# Patient Record
Sex: Male | Born: 1935 | Race: White | Hispanic: No | Smoking: Former smoker
Health system: Southern US, Community
[De-identification: ages and names within clinical notes are randomized; demographics above are authoritative.]

## PROBLEM LIST (undated history)

## (undated) DIAGNOSIS — G609 Hereditary and idiopathic neuropathy, unspecified: Secondary | ICD-10-CM

## (undated) DIAGNOSIS — N281 Cyst of kidney, acquired: Secondary | ICD-10-CM

## (undated) DIAGNOSIS — F411 Generalized anxiety disorder: Secondary | ICD-10-CM

## (undated) DIAGNOSIS — I872 Venous insufficiency (chronic) (peripheral): Secondary | ICD-10-CM

## (undated) DIAGNOSIS — K644 Residual hemorrhoidal skin tags: Secondary | ICD-10-CM

## (undated) DIAGNOSIS — R5383 Other fatigue: Secondary | ICD-10-CM

## (undated) DIAGNOSIS — E1039 Type 1 diabetes mellitus with other diabetic ophthalmic complication: Secondary | ICD-10-CM

## (undated) DIAGNOSIS — M25579 Pain in unspecified ankle and joints of unspecified foot: Secondary | ICD-10-CM

## (undated) DIAGNOSIS — J310 Chronic rhinitis: Secondary | ICD-10-CM

## (undated) DIAGNOSIS — R269 Unspecified abnormalities of gait and mobility: Secondary | ICD-10-CM

## (undated) DIAGNOSIS — I4891 Unspecified atrial fibrillation: Secondary | ICD-10-CM

## (undated) DIAGNOSIS — L570 Actinic keratosis: Secondary | ICD-10-CM

## (undated) DIAGNOSIS — I482 Chronic atrial fibrillation, unspecified: Secondary | ICD-10-CM

## (undated) DIAGNOSIS — I509 Heart failure, unspecified: Secondary | ICD-10-CM

## (undated) DIAGNOSIS — E1065 Type 1 diabetes mellitus with hyperglycemia: Secondary | ICD-10-CM

## (undated) DIAGNOSIS — H6120 Impacted cerumen, unspecified ear: Secondary | ICD-10-CM

## (undated) DIAGNOSIS — Z8673 Personal history of transient ischemic attack (TIA), and cerebral infarction without residual deficits: Secondary | ICD-10-CM

## (undated) DIAGNOSIS — I350 Nonrheumatic aortic (valve) stenosis: Secondary | ICD-10-CM

## (undated) DIAGNOSIS — F3289 Other specified depressive episodes: Secondary | ICD-10-CM

## (undated) DIAGNOSIS — N529 Male erectile dysfunction, unspecified: Secondary | ICD-10-CM

## (undated) DIAGNOSIS — M25569 Pain in unspecified knee: Secondary | ICD-10-CM

## (undated) DIAGNOSIS — L89309 Pressure ulcer of unspecified buttock, unspecified stage: Secondary | ICD-10-CM

## (undated) DIAGNOSIS — E785 Hyperlipidemia, unspecified: Secondary | ICD-10-CM

## (undated) DIAGNOSIS — I2510 Atherosclerotic heart disease of native coronary artery without angina pectoris: Secondary | ICD-10-CM

## (undated) DIAGNOSIS — K5730 Diverticulosis of large intestine without perforation or abscess without bleeding: Secondary | ICD-10-CM

## (undated) DIAGNOSIS — M76899 Other specified enthesopathies of unspecified lower limb, excluding foot: Secondary | ICD-10-CM

## (undated) DIAGNOSIS — D649 Anemia, unspecified: Secondary | ICD-10-CM

## (undated) DIAGNOSIS — IMO0002 Reserved for concepts with insufficient information to code with codable children: Secondary | ICD-10-CM

## (undated) DIAGNOSIS — E669 Obesity, unspecified: Secondary | ICD-10-CM

## (undated) DIAGNOSIS — R609 Edema, unspecified: Secondary | ICD-10-CM

## (undated) DIAGNOSIS — H8110 Benign paroxysmal vertigo, unspecified ear: Secondary | ICD-10-CM

## (undated) DIAGNOSIS — E559 Vitamin D deficiency, unspecified: Secondary | ICD-10-CM

## (undated) DIAGNOSIS — M5430 Sciatica, unspecified side: Secondary | ICD-10-CM

## (undated) DIAGNOSIS — L723 Sebaceous cyst: Secondary | ICD-10-CM

## (undated) DIAGNOSIS — I10 Essential (primary) hypertension: Secondary | ICD-10-CM

## (undated) DIAGNOSIS — D126 Benign neoplasm of colon, unspecified: Secondary | ICD-10-CM

## (undated) DIAGNOSIS — F329 Major depressive disorder, single episode, unspecified: Secondary | ICD-10-CM

## (undated) DIAGNOSIS — M1710 Unilateral primary osteoarthritis, unspecified knee: Secondary | ICD-10-CM

## (undated) DIAGNOSIS — R42 Dizziness and giddiness: Secondary | ICD-10-CM

## (undated) DIAGNOSIS — I4510 Unspecified right bundle-branch block: Secondary | ICD-10-CM

## (undated) DIAGNOSIS — M2540 Effusion, unspecified joint: Secondary | ICD-10-CM

## (undated) DIAGNOSIS — R5381 Other malaise: Secondary | ICD-10-CM

## (undated) DIAGNOSIS — I6990 Unspecified sequelae of unspecified cerebrovascular disease: Secondary | ICD-10-CM

## (undated) DIAGNOSIS — I6529 Occlusion and stenosis of unspecified carotid artery: Secondary | ICD-10-CM

## (undated) HISTORY — DX: Type 1 diabetes mellitus with hyperglycemia: E10.65

## (undated) HISTORY — DX: Other malaise: R53.81

## (undated) HISTORY — DX: Benign paroxysmal vertigo, unspecified ear: H81.10

## (undated) HISTORY — DX: Obesity, unspecified: E66.9

## (undated) HISTORY — DX: Pressure ulcer of unspecified buttock, unspecified stage: L89.309

## (undated) HISTORY — DX: Effusion, unspecified joint: M25.40

## (undated) HISTORY — DX: Unilateral primary osteoarthritis, unspecified knee: M17.10

## (undated) HISTORY — DX: Unspecified abnormalities of gait and mobility: R26.9

## (undated) HISTORY — DX: Generalized anxiety disorder: F41.1

## (undated) HISTORY — DX: Chronic rhinitis: J31.0

## (undated) HISTORY — DX: Major depressive disorder, single episode, unspecified: F32.9

## (undated) HISTORY — DX: Unspecified right bundle-branch block: I45.10

## (undated) HISTORY — DX: Actinic keratosis: L57.0

## (undated) HISTORY — DX: Heart failure, unspecified: I50.9

## (undated) HISTORY — DX: Other specified enthesopathies of unspecified lower limb, excluding foot: M76.899

## (undated) HISTORY — DX: Type 1 diabetes mellitus with other diabetic ophthalmic complication: E10.39

## (undated) HISTORY — DX: Male erectile dysfunction, unspecified: N52.9

## (undated) HISTORY — DX: Hyperlipidemia, unspecified: E78.5

## (undated) HISTORY — DX: Anemia, unspecified: D64.9

## (undated) HISTORY — DX: Venous insufficiency (chronic) (peripheral): I87.2

## (undated) HISTORY — DX: Essential (primary) hypertension: I10

## (undated) HISTORY — DX: Personal history of transient ischemic attack (TIA), and cerebral infarction without residual deficits: Z86.73

## (undated) HISTORY — PX: CATARACT EXTRACTION, BILATERAL: SHX1313

## (undated) HISTORY — DX: Sciatica, unspecified side: M54.30

## (undated) HISTORY — DX: Nonrheumatic aortic (valve) stenosis: I35.0

## (undated) HISTORY — PX: COLON SURGERY: SHX602

## (undated) HISTORY — DX: Pain in unspecified ankle and joints of unspecified foot: M25.579

## (undated) HISTORY — PX: OTHER SURGICAL HISTORY: SHX169

## (undated) HISTORY — DX: Occlusion and stenosis of unspecified carotid artery: I65.29

## (undated) HISTORY — DX: Chronic atrial fibrillation, unspecified: I48.20

## (undated) HISTORY — DX: Reserved for concepts with insufficient information to code with codable children: IMO0002

## (undated) HISTORY — DX: Cyst of kidney, acquired: N28.1

## (undated) HISTORY — DX: Other fatigue: R53.83

## (undated) HISTORY — DX: Other specified depressive episodes: F32.89

## (undated) HISTORY — DX: Pain in unspecified knee: M25.569

## (undated) HISTORY — DX: Benign neoplasm of colon, unspecified: D12.6

## (undated) HISTORY — DX: Hereditary and idiopathic neuropathy, unspecified: G60.9

## (undated) HISTORY — DX: Impacted cerumen, unspecified ear: H61.20

## (undated) HISTORY — DX: Residual hemorrhoidal skin tags: K64.4

## (undated) HISTORY — DX: Diverticulosis of large intestine without perforation or abscess without bleeding: K57.30

## (undated) HISTORY — PX: KIDNEY CYST REMOVAL: SHX684

## (undated) HISTORY — DX: Vitamin D deficiency, unspecified: E55.9

## (undated) HISTORY — DX: Unspecified sequelae of unspecified cerebrovascular disease: I69.90

## (undated) HISTORY — DX: Edema, unspecified: R60.9

## (undated) HISTORY — DX: Unspecified atrial fibrillation: I48.91

## (undated) HISTORY — DX: Atherosclerotic heart disease of native coronary artery without angina pectoris: I25.10

## (undated) HISTORY — DX: Dizziness and giddiness: R42

## (undated) HISTORY — DX: Sebaceous cyst: L72.3

---

## 1985-06-27 HISTORY — PX: CIRCUMCISION: SUR203

## 1990-06-27 HISTORY — PX: KNEE ARTHROSCOPY: SUR90

## 1990-06-27 HISTORY — PX: NEPHRECTOMY: SHX65

## 1991-06-28 HISTORY — PX: ASPIRATION / INJECTION RENAL CYST: SUR114

## 1998-01-15 ENCOUNTER — Inpatient Hospital Stay (HOSPITAL_COMMUNITY): Admission: EM | Admit: 1998-01-15 | Discharge: 1998-01-19 | Payer: Self-pay | Admitting: Internal Medicine

## 1998-03-09 ENCOUNTER — Ambulatory Visit (HOSPITAL_COMMUNITY): Admission: RE | Admit: 1998-03-09 | Discharge: 1998-03-09 | Payer: Self-pay | Admitting: *Deleted

## 1999-08-23 ENCOUNTER — Encounter: Admission: RE | Admit: 1999-08-23 | Discharge: 1999-11-21 | Payer: Self-pay | Admitting: Internal Medicine

## 2001-03-19 ENCOUNTER — Other Ambulatory Visit: Admission: RE | Admit: 2001-03-19 | Discharge: 2001-03-19 | Payer: Self-pay | Admitting: Gastroenterology

## 2001-03-19 ENCOUNTER — Encounter (INDEPENDENT_AMBULATORY_CARE_PROVIDER_SITE_OTHER): Payer: Self-pay

## 2001-03-19 LAB — HM COLONOSCOPY

## 2001-09-18 ENCOUNTER — Emergency Department (HOSPITAL_COMMUNITY): Admission: EM | Admit: 2001-09-18 | Discharge: 2001-09-18 | Payer: Self-pay | Admitting: Emergency Medicine

## 2001-09-18 ENCOUNTER — Encounter: Payer: Self-pay | Admitting: Emergency Medicine

## 2003-10-06 ENCOUNTER — Encounter: Admission: RE | Admit: 2003-10-06 | Discharge: 2003-11-03 | Payer: Self-pay | Admitting: Internal Medicine

## 2006-07-12 ENCOUNTER — Ambulatory Visit: Payer: Self-pay | Admitting: Gastroenterology

## 2007-04-04 ENCOUNTER — Encounter (HOSPITAL_BASED_OUTPATIENT_CLINIC_OR_DEPARTMENT_OTHER): Admission: RE | Admit: 2007-04-04 | Discharge: 2007-07-03 | Payer: Self-pay | Admitting: Surgery

## 2008-07-06 HISTORY — PX: OTHER SURGICAL HISTORY: SHX169

## 2008-07-08 ENCOUNTER — Inpatient Hospital Stay (HOSPITAL_BASED_OUTPATIENT_CLINIC_OR_DEPARTMENT_OTHER): Admission: RE | Admit: 2008-07-08 | Discharge: 2008-07-08 | Payer: Self-pay | Admitting: Cardiology

## 2009-04-12 IMAGING — CR DG CHEST 2V
1 series · 2 of 2 positions shown · non-contrast
Comparison: NONE

CLINICAL DATA: Shortness of breath. Fatigue. 

CHEST TWO VIEW (PA AND LATERAL)

[Series 1: view not recorded · 0.17mm/px · 2 of 2 slices shown]
[im 1/2]
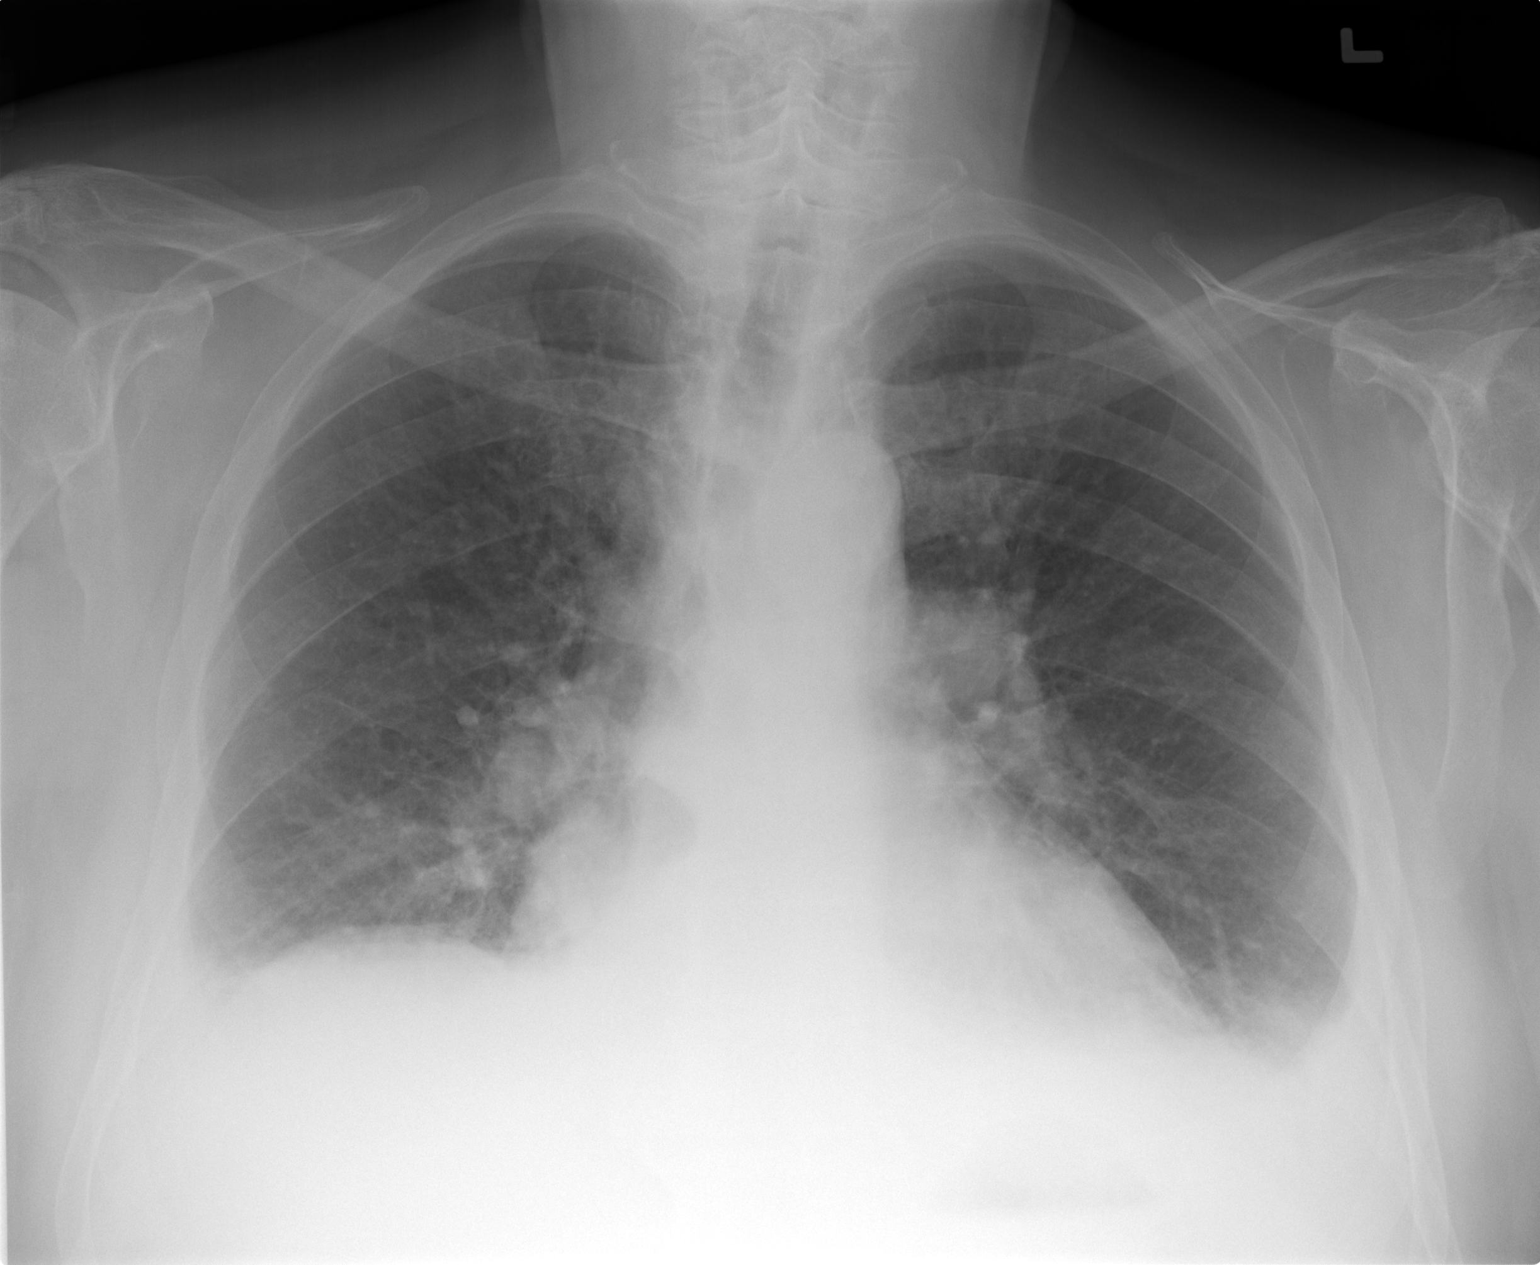
[im 2/2]
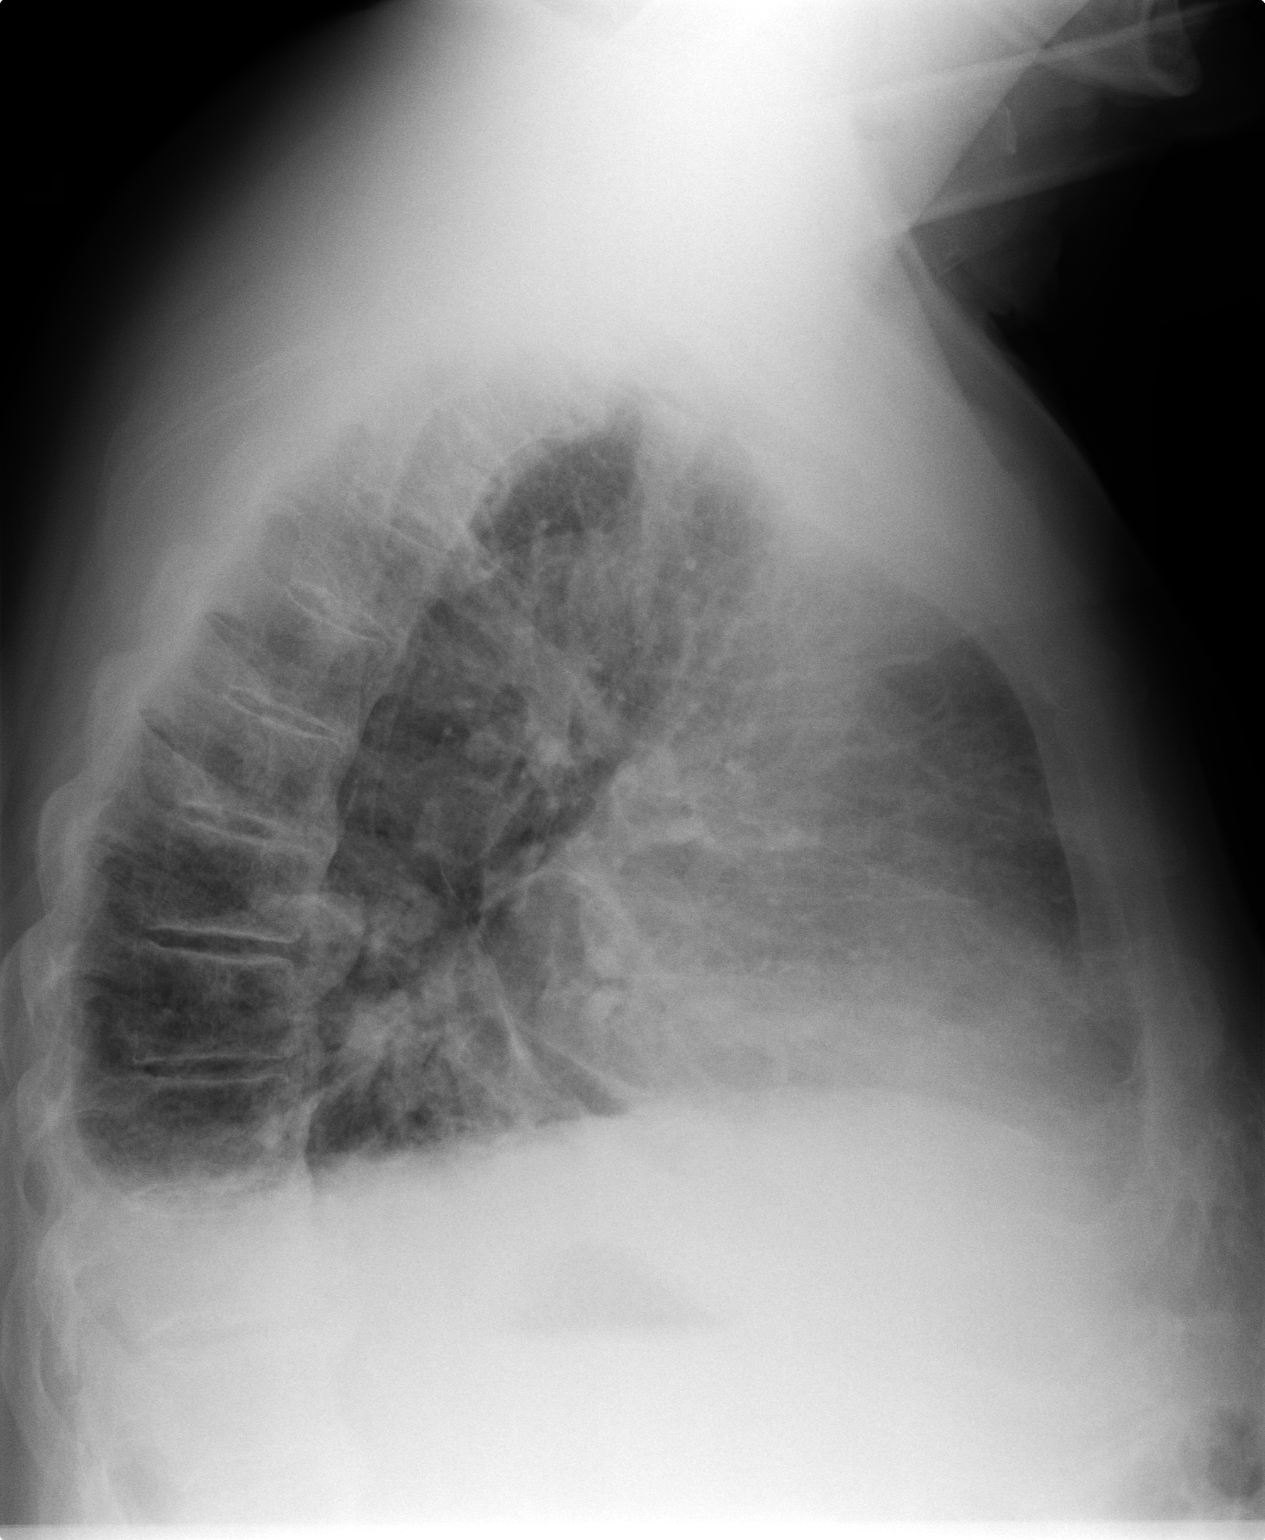

[2 of 2 positions shown; findings below may reference images not displayed]

FINDINGS: The heart is upper limits of normal in size. Bilateral 
pleural effusions with interstitial edema.
IMPRESSION: Mild congestive heart failure. Tiger, Chai Date:  06/09/2008 DAS  [REDACTED]

## 2010-10-11 LAB — POCT I-STAT GLUCOSE
Glucose, Bld: 189 mg/dL — ABNORMAL HIGH (ref 70–99)
Operator id: 194801

## 2010-11-09 NOTE — H&P (Signed)
NAME:  Logan Sharp, Logan Sharp NO.:  [id number]   MEDICAL RECORD NO.:  [id number]          PATIENT TYPE:  OIB   LOCATION:  NA                           FACILITY:  [facility]   PHYSICIAN:  Peter M. Swaziland, M.D.  DATE OF BIRTH:  [birth year]   DATE OF ADMISSION:  DATE OF DISCHARGE:                              HISTORY & PHYSICAL   HISTORY OF PRESENT ILLNESS:  Mr. Purohit is an 77 year old white male who  is seen for evaluation of arrhythmia and congestive heart failure.  The  patient had extensive evaluation 3-4 years ago, at which time he had an  echocardiogram and an adenosine Cardiolite study.  His Cardiolite study  at that time was unremarkable.  He apparently was tried on Toprol and  then Coreg, but was unable to tolerate either due to significant  dizziness, fatigue, and confusion.  He has been unaware of his heart  skipping and is only noted when someone checks his pulse or ECG.  He  denied any chest pain, shortness of breath, or palpitations until  recently when he did note some increased shortness of breath and chest x-  ray showed evidence of mild pulmonary edema.  He was started on Lasix at  that time and his shortness of breath resolved.  The patient was found  to be in atrial fibrillation.  Subsequent evaluation with an  echocardiogram on July 02, 2008, showed moderate concentric LVH with  low normal systolic function, ejection fraction is estimated at 50%.  He  had mild aortic stenosis with a valve gradient peak of 13 mmHg, mean  gradient of 7.1 mmHg, and aortic valve area of 2 sq. cm.  He had mild  mitral and tricuspid insufficiency, moderate left atrial enlargement,  and mild pulmonary hypertension.  He further underwent an adenosine  Cardiolite study on July 03, 2007, which showed evidence of moderate  lateral wall ischemia and moderate LV dysfunction with ejection fraction  of 40%.  Based on these findings,  it was recommended that the patient  undergo cardiac  catheterization.   PAST MEDICAL HISTORY:  1. Diabetes mellitus, insulin requiring.  2. Hypertension.  3. Peripheral neuropathy.  4. Prior TIA 3 years ago.  5. Chronic atrial fibrillation.  6. Status post removal of cyst from his kidney in 1993.  7. History of colon surgery in 1993 and 1994.  8. History of peptic ulcer disease with bleeding, last episode in      1999.   He has no known allergies.   CURRENT MEDICATIONS:  1. Benicar HCT 40/25 mg per day.  2. Novolin insulin 25 units subcu in the morning, 15 units subcu in      the evening.  3. Doxepin 50 mg at bedtime.  4. Furosemide 40 mg per day.  5. Coumadin 5 mg 6 days a week and 7.5 mg 1 day a week.   SOCIAL HISTORY:  The patient is a retired Medical illustrator.  He has prior  history of alcoholism and quit in 1974.  He also quit smoking in 1974.  He is divorced and has no children.  FAMILY HISTORY:  Father died at age 1 of unknown causes.  He was also  an alcoholic.  Mother died at age 2 from natural causes.  One brother  is aged 47 and has had 3 prior myocardial infarctions.  One sister aged  52 has had an aortic valve replacement.   REVIEW OF SYSTEMS:  The patient denies any orthopnea, PND, or edema.  He  denies any bleeding difficulties.  He has had no claudication.  He has  no recent change in bowel or bladder habits.  All other review of  systems are negative and are reviewed.   PHYSICAL EXAMINATION:  GENERAL:  The patient is a pleasant white male,  in no apparent distress.  VITAL SIGNS:  Weighs 246, blood pressure is 162/80, pulse is 100 and  irregular, and respirations are normal.  HEENT:  He is normocephalic and atraumatic.  Pupils equal, round, and  reactive to light and accommodation.  Extraocular movements are full.  Oropharynx is clear.  NECK:  Supple without JVD, adenopathy, thyromegaly, or bruits.  LUNGS:  Clear to auscultation and percussion.  CARDIAC:  Irregular rate and rhythm with a grade 2/6 systolic  murmur at  the apex and across the precordium.  ABDOMEN:  Obese, soft, and nontender without mass or bruits.  EXTREMITIES:  Without edema.  Pedal pulses are 2+ and symmetric.  SKIN:  Warm and dry.  NEUROLOGIC:  Nonfocal.   LABORATORY DATA:  ECG shows atrial fibrillation with occasional PVCs.  He has an incomplete right bundle-branch block pattern.  Chest x-ray  today shows mild cardiomegaly with pulmonary venous hypertension.  There  is no edema.  His glucose is 249, BUN 26, and creatinine 1.0.  Electrolytes are normal.  CBC is normal with the exception of platelet  count of 103,000.  Coags are pending.   IMPRESSION:  1. Abnormal adenosine Cardiolite study consistent with lateral wall      ischemia.  2. Chronic persistent atrial fibrillation, rate control was      suboptimal.  3. Congestive heart failure, recent onset, improved with diuretic      therapy.  4. Diabetes mellitus, insulin requiring.  5. Hypertension.  6. Lipid status is unknown.   PLAN:  We will proceed with diagnostic cardiac catheterization.  Depending on these results, we will likely resume his Coumadin  afterwards.  Recommended addition of Lanoxin to further his rate  control.  He has a history of intolerance to beta-blockers in the past.  Further plans pending his cardiac catheterization results.           ______________________________  Peter M. Swaziland, M.D.     PMJ/MEDQ  D:  07/07/2008  T:  07/08/2008  Job:  [id number]   cc:   Lenon Curt. Chilton Si, M.D.
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
## 2010-11-09 NOTE — Consult Note (Signed)
NAME:  Logan Sharp, Logan Sharp              ACCOUNT NO.:  [id number]   MEDICAL RECORD NO.:  [id number]          PATIENT TYPE:  REC   LOCATION:  FOOT                         FACILITY:  [facility]   PHYSICIAN:  Jonelle Sports. Sevier, M.D. DATE OF BIRTH:  11/11/1923   DATE OF CONSULTATION:  05/02/2007  DATE OF DISCHARGE:                                 CONSULTATION   HISTORY:  This 77 year old white male is seen with diabetic ulcerations  on the dorsal aspects of both feet.  These apparently occurred when he  wore some Dock-sider-type shoes without socks, and walked a modest  distance on uneven terrain, rubbing dorsal lesions on his feet which  then got secondarily aggravated by an allergic reaction to Neosporin.  He has been treated here with Prisma hydrogel and dry dressings, as well  as, in healing-type sandals, and his wounds have progressively improved.  Since he was last seen he has noted himself continued improvement, in  particular in the wound of the left foot, and it is now essentially  resolved.   He reports no new systemic or other worrisome symptoms.   PHYSICAL EXAMINATION:  VITAL SIGNS:  Blood pressure 172/82, pulse 92  regular, respirations 18, temperature 98.9 capillary blood glucose 113.  FOCUSED EXAM:  The linear wound overlying the distal aspect of the first  metatarsal head area and MP joint on the right is indeed resolved with  minimal crust at its surface.  That on the right dorsal forefoot  laterally at the base of the fourth and fifth toes measures 3.2 x 1.7 x  1.1 cm which is not largely changed, but there is clearly improvement in  the appearance of the wound base which has 1) filled in a bit and 2) has  nicely granulated.   IMPRESSION:  Improvement diabetic foot ulcers with healing of that on  the left.   DISPOSITION:  1. The left foot can now return to normal footwear, but this should be      used only with socks worn, and he is requested to make sure that      shoe is  properly fitting with no slippage of foot within the shoe;      and yet on the other hand, the shoe not too tight so as to create      pressure problems.  Also he is advised to inspect his foot      frequently for any renewed problems.  2. The wound on the right foot will be treated, again, with an      application of Prisma, and hydrogel covered with a Telfa pad, and      then a dry dressing.  He is to keep this foot dry and not to change      the dressing until 5 days.  At that point, he is advised he may      remove the dressing, and may begin to treat that wound simply with      Polysporin and a dry dressing.  He is to remain in protective      footwear on that extremity.  FOLLOWUP VISIT:  To be here in 2 weeks with the anticipation that this  will likely be healed at that time.           ______________________________  Jonelle Sports Cheryll Cockayne, M.D.     RES/MEDQ  D:  05/02/2007  T:  05/03/2007  Job:  [id number]

## 2010-11-09 NOTE — Assessment & Plan Note (Signed)
[facility]   NAME:  Logan Sharp, Logan Sharp              ACCOUNT NO.:  [id number]   MEDICAL RECORD NO.:  [id number]      DATE OF BIRTH:  1924-02-28   PHYSICIAN:  Theresia Majors. Tanda Rockers, M.D. VISIT DATE:  06/06/2007                                   OFFICE VISIT   SUBJECTIVE:  Logan Sharp returns for followup of bilateral stasis with  ulcerations on the right dorsal and the left dorsal feet.  In the  interim. he reports that there has been no drainage.  These wounds are  essentially healed.  There has been no fever.  He continues to be  ambulatory.   OBJECTIVE:  Blood pressure 166/94, respirations 18, pulse rate 96,  temperature 98.3.  Inspection of the lower extremity does indeed confirm  that wounds two and one are completely resolved.  There is persistence  of 2+ edema.  Capillary refill is brisk.  Both legs are warm but not  feverish.  There is no evidence of ischemia.   ASSESSMENT:  Resolved stasis.   PLAN:  We are recommending that the patient procure and wear bilateral  30-40 mm compression hose open toe.  We have explained the use of the  hose and the control of edema to the patient in terms that he seems to  understand.  We have given him an opportunity to ask questions.  He  expresses gratitude for having been seen in the clinic and indicates  that he will be compliant.  The patient is discharged.      Harold A. Tanda Rockers, M.D.  Electronically Signed     HAN/MEDQ  D:  06/06/2007  T:  06/07/2007  Job:  [id number]   cc:   Lenon Curt. Chilton Si, M.D.

## 2010-11-09 NOTE — Cardiovascular Report (Signed)
NAME:  Logan Sharp, Logan Sharp NO.:  [id number]   MEDICAL RECORD NO.:  [id number]          PATIENT TYPE:  OIB   LOCATION:  1961                         FACILITY:  [facility]   PHYSICIAN:  Peter M. Swaziland, M.D.  DATE OF BIRTH:  1923-07-20   DATE OF PROCEDURE:  07/08/2008  DATE OF DISCHARGE:  07/08/2008                            CARDIAC CATHETERIZATION   INDICATIONS FOR PROCEDURE:  An 77 year old white male who has chronic  persistent atrial fibrillation.  He has had recent symptoms of  congestive heart failure.  Cardiolite study was abnormal showing  evidence of extensive lateral wall ischemia.   PROCEDURES:  1. Left heart catheterization.  2. Coronary and left ventricular angiography.   EQUIPMENTS USED:  1. A 4-French arterial sheath.  2. A 4-French left Judkins catheter.  3. A 4-French 3DRC catheter.  4. A 4-French pigtail catheter.   MEDICATIONS:  Local anesthesia with 1% Xylocaine.   TOTAL CONTRAST USED:  90 mL of Omnipaque.   HEMODYNAMIC DATA:  Aortic pressure is 166/76 with a mean of 114.  Left  ventricle pressure is 162 with an EDP of 26 mmHg.   ANGIOGRAPHIC DATA:  The left coronary artery arises and distributes  normally.  The left main coronary is calcified.  There is 20-30%  narrowing in the distal left main.   Left anterior descending artery gives rise to a large diagonal branch.  The LAD has calcified with 40-50% stenosis after the first diagonal.  The first diagonal also has about 40-50% disease proximally.   The left circumflex coronary has diffuse 30% narrowing proximally and is  calcified.  It is occluded after the first obtuse marginal vessel.  The  first obtuse marginal vessel has a 50% stenosis at its origin.   The right coronary artery arises and distributes normally.  It is a very  large dominant vessel.  It is mild to moderately calcified in the  proximal to mid vessel.  There is 30% narrowing proximally to the crux.  The PDA and  posterolateral branches are without significant obstructive  disease.   The left ventricular angiography was performed in the RAO view.  This  demonstrates normal left ventricular size.  There is modest inferior  wall hypokinesia with overall ejection fraction estimated at 45-50%.  There is no significant mitral insufficiency.   FINAL INTERPRETATION:  1. Single-vessel occlusive atherosclerotic coronary artery disease      involving the mid left circumflex coronary.  The patient has modest      nonobstructive disease in the left anterior descending and diagonal      distribution.  2. Mild left ventricular dysfunction.   PLAN:  Recommend continued medical therapy.           ______________________________  Peter M. Swaziland, M.D.     PMJ/MEDQ  D:  08/18/2008  T:  08/19/2008  Job:  [id number]   cc:   Lenon Curt. Chilton Si, M.D.

## 2010-11-09 NOTE — Assessment & Plan Note (Signed)
[facility]   NAME:  Logan Sharp, Logan Sharp              ACCOUNT NO.:  [id number]   MEDICAL RECORD NO.:  [id number]      DATE OF BIRTH:  20-Mar-1924   PHYSICIAN:  Jonelle Sports. Sevier, M.D.  VISIT DATE:  05/16/2007                                   OFFICE VISIT   HISTORY:  This 77 year old white male has been followed for abraded type  diabetic foot ulcers on the dorsal aspect of both feet which came from  friction of improper footwear.   When he was last seen a week ago that on the dorsal aspect of the left  foot had healed but he raises concerns today that there is a rough spot  there that he is concerned about.   He has been treating the lesion on the dorsal aspect of the right foot  with cleansing and Polysporin and dry dressing each day at home.  He  reports that he feels there has been progress.  There has been no odor,  no drainage, no increased swelling, no pain and no fever or systemic  symptoms.  Blood pressure is 177/92, about which the patient is  cautioned.  Pulse is 60, respirations 18, temperature is 98.5. Random  blood glucose 114 mg/dL.   The linear wound site on the dorsal aspect of the right foot first and  second metatarsal areas is indeed somewhat crusty and there is one  palpable elevated area in the distal portion of the wound which is of  concern to the patient.   The wound on the dorsal aspect of the left foot laterally over the fifth  metatarsal head area measures 2.1 x 1.0 x 0.1 cm and again has some  fibrinopurulent slough in the base.   IMPRESSION:  Satisfactory course of diabetic wounds both feet.   DISPOSITION:  The wound on the left foot is sharply debrided of some of  the crust and particularly I was able to shave down the area that was  quite elevated and of concern to the patient and was able to do this  without revealing any new ulceration.   It is recommended to the patient that he use some cocoa butter for  continued softening  in that area.   The ulcer on the right foot is treated with a sharp selective  debridement of this slough from the wound base and it is then treated  with an application of Bactroban and a Band-Aid dressing.  He will  continue to address this wound at home with daily cleansing, Polysporin  and a Band-Aid.   He is cautioned to wear shoes that avoid too much pressure on the dorsal  aspect of his feet.  He asked about playing golf and is advised that it  is a bit early for that yet.   Follow-up visit will be here in 3 weeks.          ______________________________  Jonelle Sports Cheryll Cockayne, M.D.    RES/MEDQ  D:  05/16/2007  T:  05/17/2007  Job:  [id number]
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
## 2010-11-12 NOTE — Assessment & Plan Note (Signed)
[facility]                         GASTROENTEROLOGY OFFICE NOTE   NAME:Logan Sharp, DAUNTE OESTREICH                     MRN:          [id number]  DATE:07/12/2006                            DOB:          [birth year]    CHIEF COMPLAINT:  An 77 year old white male with insulin-dependent  diabetes mellitus.  He returns for followup of adenomatous colon polyps.  His primary physician is Dr. Frederik Pear.  The patient has had no  significant gastrointestinal complaints except for some mild  constipation when he first began Benicar in September 2007.  These  symptoms spontaneously resolved.  He has no other abdominal complaints  and, specifically denies any abdominal pain, change in bowel habits,  change in stool caliber, melena, hematochezia, or weight loss.  There is  no family history of colon cancer, colon polyps, or inflammatory bowel  disease.  He has a history of adenomatous colon polyps first diagnosed  in 1994; the largest polyp was approximately 15 mm.  His most recent  followup colonoscopy was in September 2002 which showed diverticulosis  and a hyperplastic colon polyp.  He is status post a right hemicolectomy  for acute appendicitis complicated by rupture and appendiceal abscess in  1994.   PAST MEDICAL HISTORY:  1. Hypertension.  2. Diabetes mellitus.  3. Arthritis, status post right hemicolectomy for an appendiceal      rupture and abscess.  4. Adenomatous colon polyps.  5. Diverticulosis.   MEDICATIONS:  Listed on the chart, reviewed.   MEDICATION ALLERGIES:  None known.   SOCIAL HISTORY AND REVIEW OF SYSTEMS:  Per the handwritten form.   PHYSICAL EXAM:  No acute distress.  Height 6 feet 2 inches.  Weight 257.4 pounds.  Blood pressure 172/80.  Pulse 68 and irregular.  HEENT: Anicteric sclerae.  Oropharynx clear.  CHEST:  Clear to auscultation bilaterally.  CARDIAC:  Regular rate and rhythm without murmurs appreciated.  ABDOMEN:  Soft, nontender,  nondistended.  Normoactive bowel sounds.  No  palpable organomegaly, masses, or hernias.  RECTAL:  Deferred at the time of colonoscopy.  EXTREMITIES:  Without clubbing, cyanosis, or edema.  NEUROLOGIC:  Alert and oriented x3.  Grossly nonfocal.   ASSESSMENT AND PLAN:  Personal history of adenomatous colon polyps.  Diverticulosis.  He is advised to maintain a high-fiber diet with  adequate fluid intake.  Risks, benefits, and alternatives to colonoscopy  with possible biopsy and possible polypectomy discussed with the  patient and he consents to proceed.  This will be scheduled electively.  Insulin will be adjusted as per standard protocol.     Venita Lick. Russella Dar, MD, [facility]  Electronically Signed    MTS/MedQ  DD: 07/13/2006  DT: 07/13/2006  Job #: [id number]   cc:   Lenon Curt. Chilton Si, M.D.

## 2011-10-13 ENCOUNTER — Other Ambulatory Visit: Payer: Self-pay | Admitting: Dermatology

## 2011-10-17 ENCOUNTER — Encounter: Payer: Self-pay | Admitting: *Deleted

## 2011-12-15 ENCOUNTER — Encounter: Payer: Self-pay | Admitting: Cardiology

## 2011-12-15 ENCOUNTER — Other Ambulatory Visit: Payer: Self-pay | Admitting: Cardiology

## 2012-01-02 ENCOUNTER — Emergency Department (HOSPITAL_COMMUNITY)
Admission: EM | Admit: 2012-01-02 | Discharge: 2012-01-02 | Disposition: A | Discharge status: Home / Self Care | Payer: Medicare Other | Attending: Emergency Medicine | Admitting: Emergency Medicine

## 2012-01-02 ENCOUNTER — Encounter (HOSPITAL_COMMUNITY): Payer: Self-pay | Admitting: *Deleted

## 2012-01-02 DIAGNOSIS — Z79899 Other long term (current) drug therapy: Secondary | ICD-10-CM | POA: Insufficient documentation

## 2012-01-02 DIAGNOSIS — I4891 Unspecified atrial fibrillation: Secondary | ICD-10-CM | POA: Insufficient documentation

## 2012-01-02 DIAGNOSIS — Z794 Long term (current) use of insulin: Secondary | ICD-10-CM | POA: Insufficient documentation

## 2012-01-02 DIAGNOSIS — I2510 Atherosclerotic heart disease of native coronary artery without angina pectoris: Secondary | ICD-10-CM | POA: Insufficient documentation

## 2012-01-02 DIAGNOSIS — E119 Type 2 diabetes mellitus without complications: Secondary | ICD-10-CM | POA: Insufficient documentation

## 2012-01-02 DIAGNOSIS — I509 Heart failure, unspecified: Secondary | ICD-10-CM | POA: Insufficient documentation

## 2012-01-02 DIAGNOSIS — Z87891 Personal history of nicotine dependence: Secondary | ICD-10-CM | POA: Insufficient documentation

## 2012-01-02 DIAGNOSIS — I10 Essential (primary) hypertension: Secondary | ICD-10-CM | POA: Insufficient documentation

## 2012-01-02 DIAGNOSIS — R42 Dizziness and giddiness: Secondary | ICD-10-CM | POA: Insufficient documentation

## 2012-01-02 DIAGNOSIS — Z8673 Personal history of transient ischemic attack (TIA), and cerebral infarction without residual deficits: Secondary | ICD-10-CM | POA: Insufficient documentation

## 2012-01-02 LAB — COMPREHENSIVE METABOLIC PANEL
BUN: 17 mg/dL (ref 6–23)
Calcium: 10 mg/dL (ref 8.4–10.5)
Creatinine, Ser: 0.9 mg/dL (ref 0.50–1.35)
GFR calc Af Amer: 87 mL/min — ABNORMAL LOW (ref >90)
GFR calc non Af Amer: 75 mL/min — ABNORMAL LOW (ref >90)
Glucose, Bld: 139 mg/dL — ABNORMAL HIGH (ref 70–99)
Sodium: 135 mEq/L (ref 135–145)
Total Protein: 7.8 g/dL (ref 6.0–8.3)

## 2012-01-02 LAB — GLUCOSE, CAPILLARY: Glucose-Capillary: 132 mg/dL — ABNORMAL HIGH (ref 70–99)

## 2012-01-02 LAB — POCT I-STAT, CHEM 8
BUN: 16 mg/dL (ref 6–23)
Calcium, Ion: 1.25 mmol/L (ref 1.13–1.30)
Creatinine, Ser: 1 mg/dL (ref 0.50–1.35)
Hemoglobin: 14.3 g/dL (ref 13.0–17.0)
TCO2: 23 mmol/L (ref 0–100)

## 2012-01-02 LAB — CBC
HCT: 41.2 % (ref 39.0–52.0)
Hemoglobin: 14.3 g/dL (ref 13.0–17.0)
MCH: 30.8 pg (ref 26.0–34.0)
MCHC: 33.7 g/dL (ref 30.0–36.0)
MCV: 91.6 fL (ref 78.0–100.0)
Platelets: 99 10*3/uL — ABNORMAL LOW (ref 150–400)
RBC: 4.57 MIL/uL (ref 4.22–5.81)
RBC: 4.54 MIL/uL (ref 4.22–5.81)
WBC: 7.9 10*3/uL (ref 4.0–10.5)

## 2012-01-02 MED ORDER — MECLIZINE HCL 25 MG PO TABS
50.0000 mg | ORAL_TABLET | Freq: Three times a day (TID) | ORAL | Status: AC | PRN
Start: 2012-01-02 — End: 2012-01-12

## 2012-01-02 MED ORDER — MECLIZINE HCL 25 MG PO TABS
50.0000 mg | ORAL_TABLET | Freq: Once | ORAL | Status: AC
  Administered 2012-01-02: 50 mg via ORAL
  Filled 2012-01-02: qty 2

## 2012-01-02 NOTE — ED Notes (Signed)
MD at bedside. 

## 2012-01-02 NOTE — ED Notes (Signed)
Pt states been having dizziness x 3 weeks tomorrow, been to PCP and Dr. Ezzard Standing, had ears cleaned out by PCP and then sent to therapy [facility], was suppose to start therapy tomorrow at [facility]. Dr. Ezzard Standing told him he needed to get out of house and walk more, was not given any medication.

## 2012-01-02 NOTE — ED Notes (Signed)
Pt alert and oriented x4. Respirations even and unlabored, bilateral symmetrical rise and fall of chest. Skin warm and dry. In no acute distress. Denies needs.   

## 2012-01-02 NOTE — ED Notes (Signed)
Pt stated that medication did not help and he still feels the same.

## 2012-01-02 NOTE — ED Notes (Signed)
Pt states 3 weeks from tomorrow, he's been having dizziness/weakness, states he feels like he is going to pass out, states he has gone to his PCP who cleaned his ears out and referred him to therapy [facility], which he was suppose to start tomorrow, then went and saw Dr. Ezzard Standing who told him "to get out more and walk around". Pt states dizziness is worse today, has been unable to walk d/t dizziness, feeling like he's going to pass out was so bad. Pt states before today he had been driving. Pt seems nervous laying in bed. Denies pain and n/v/d. Denies numbness/tingling/ and shortness of breath.

## 2012-01-02 NOTE — ED Notes (Signed)
Pt given discharge instructions, explained, in no distress upon discharge, escorted to discharge window.

## 2012-01-02 NOTE — ED Provider Notes (Signed)
History     CSN: [id number]  Arrival date & time 01/02/12  1006   First MD Initiated Contact with Patient 01/02/12 1055      No chief complaint on file.   (Consider location/radiation/quality/duration/timing/severity/associated sxs/prior treatment) HPI Patient reports dizziness meaning sensation of his head spinning, not lightheadedness onset 3 weeks ago becoming worse today he had trouble walking this morning, was able walk to his car with assistance. Symptoms worse with changing positions, particularly lying down from seated position or sitting up from a supine position. Improved with remaining still No pain no weakness no treatment prior to coming here. Patient evaluated by Dr. Ezzard Standing for same complaint. Patient reports he was advised that medications to treat dizziness may cause drowsiness and cause falls. Patient also evaluated by Dr. Chilton Si for same complaint who advised physical therapy. No other associated symptoms no nausea no vomiting no difficulty with speech. No focal weakness Past Medical History  Diagnosis Date  . Coronary artery disease   . CHF (congestive heart failure)   . Hypertension   . Diabetes mellitus   . Chronic atrial fibrillation   . Mild aortic stenosis   . Hx of transient ischemic attack (TIA)     Past Surgical History  Procedure Date  . Kidney cyst removal   . Colon surgery     x2  . Cadiac cath     2010    Family History  Problem Relation Age of Onset  . Heart attack    . Heart disease      History  Substance Use Topics  . Smoking status: Former Smoker    Quit date: 06/27/1972  . Smokeless tobacco: Never Used  . Alcohol Use: No     quit 1974      Review of Systems  Constitutional: Negative.   HENT: Negative.   Respiratory: Negative.   Cardiovascular: Negative.   Gastrointestinal: Negative.   Musculoskeletal: Negative.   Skin: Negative.   Neurological: Positive for dizziness.  Hematological: Negative.   Psychiatric/Behavioral:  Negative.     Allergies  Review of patient's allergies indicates no known allergies.  Home Medications   Current Outpatient Rx  Name Route Sig Dispense Refill  . VITAMIN D 1000 UNITS PO TABS Oral Take 1,000 Units by mouth daily.    Marland Kitchen CLOPIDOGREL BISULFATE 75 MG PO TABS Oral Take 75 mg by mouth daily.    Marland Kitchen DIGOXIN 0.25 MG PO TABS Oral Take 250 mcg by mouth daily.    Marland Kitchen DOXEPIN HCL 50 MG PO CAPS Oral Take 50 mg by mouth at bedtime.    . INSULIN ASPART PROT & ASPART (70-30) 100 UNIT/ML [place] SUSP Subcutaneous Inject 30-40 Units into the skin See admin instructions. 40 units before breakfast. 30 units before supper.    Marland Kitchen LOSARTAN POTASSIUM-HCTZ 100-25 MG PO TABS Oral Take 1 tablet by mouth daily.    . TORSEMIDE 20 MG PO TABS Oral Take 20 mg by mouth every Monday, Wednesday, and Friday.       BP 182/75  Pulse 45  Temp 98.8 F (37.1 C) (Oral)  Resp 12  SpO2 94%  Physical Exam  Nursing note and vitals reviewed. Constitutional: He appears well-developed and well-nourished.  HENT:  Head: Normocephalic and atraumatic.  Eyes: Conjunctivae are normal. Pupils are equal, round, and reactive to light.  Neck: Neck supple. No tracheal deviation present. No thyromegaly present.  Cardiovascular: Normal rate and regular rhythm.   No murmur heard.      Irregularly  irregular  Pulmonary/Chest: Effort normal and breath sounds normal.  Abdominal: Soft. Bowel sounds are normal. He exhibits no distension. There is no tenderness.  Musculoskeletal: Normal range of motion. He exhibits no edema and no tenderness.  Neurological: He is alert. He has normal reflexes. Coordination normal.       Becomes vertiginous upon sitting up from a seated position  Skin: Skin is warm and dry. No rash noted.  Psychiatric: He has a normal mood and affect.    ED Course  Procedures (including critical care time)  Labs Reviewed  COMPREHENSIVE METABOLIC PANEL - Abnormal; Notable for the following:    Glucose, Bld 139 (*)      Alkaline Phosphatase 148 (*)     GFR calc non Af Amer 75 (*)     GFR calc Af Amer 87 (*)     All other components within normal limits  CBC - Abnormal; Notable for the following:    Platelets 94 (*)     All other components within normal limits  GLUCOSE, CAPILLARY - Abnormal; Notable for the following:    Glucose-Capillary 132 (*)     All other components within normal limits  CBC   No results found.   No diagnosis found. 1:35 PM feels improved after treatment withMeclizine. Patient is able walk without difficulty   Date: 01/02/2012  Rate: 110  Rhythm: atrial fibrillation  QRS Axis: normal  Intervals: normal  ST/T Wave abnormalities: nonspecific T wave changes  Conduction Disutrbances:right bundle branch block  Narrative Interpretation:   Old EKG Reviewed: Slightly tachycardic in comparison to tracing of 09/18/2001 otherwise no significant change Results for orders placed during the hospital encounter of 01/02/12  COMPREHENSIVE METABOLIC PANEL      Component Value Range   Sodium 135  135 - 145 mEq/L   Potassium 3.9  3.5 - 5.1 mEq/L   Chloride 100  96 - 112 mEq/L   CO2 24  19 - 32 mEq/L   Glucose, Bld 139 (*) 70 - 99 mg/dL   BUN 17  6 - 23 mg/dL   Creatinine, Ser 1.19  0.50 - 1.35 mg/dL   Calcium 14.7  8.4 - 82.9 mg/dL   Total Protein 7.8  6.0 - 8.3 g/dL   Albumin 4.1  3.5 - 5.2 g/dL   AST 24  0 - 37 U/L   ALT 25  0 - 53 U/L   Alkaline Phosphatase 148 (*) 39 - 117 U/L   Total Bilirubin 1.1  0.3 - 1.2 mg/dL   GFR calc non Af Amer 75 (*) >90 mL/min   GFR calc Af Amer 87 (*) >90 mL/min  CBC      Component Value Range   WBC 7.9  4.0 - 10.5 K/uL   RBC 4.57  4.22 - 5.81 MIL/uL   Hemoglobin 14.3  13.0 - 17.0 g/dL   HCT 56.2  13.0 - 86.5 %   MCV 90.2  78.0 - 100.0 fL   MCH 31.3  26.0 - 34.0 pg   MCHC 34.7  30.0 - 36.0 g/dL   RDW 78.4  69.6 - 29.5 %   Platelets 94 (*) 150 - 400 K/uL  GLUCOSE, CAPILLARY      Component Value Range   Glucose-Capillary 132 (*) 70 - 99  mg/dL   Comment 1 Notify RN     Comment 2 Documented in Chart    CBC      Component Value Range   WBC 8.2  4.0 - 10.5  K/uL   RBC 4.54  4.22 - 5.81 MIL/uL   Hemoglobin 14.0  13.0 - 17.0 g/dL   HCT 16.1  09.6 - 04.5 %   MCV 91.6  78.0 - 100.0 fL   MCH 30.8  26.0 - 34.0 pg   MCHC 33.7  30.0 - 36.0 g/dL   RDW 40.9  81.1 - 91.4 %   Platelets 99 (*) 150 - 400 K/uL  POCT I-STAT, CHEM 8      Component Value Range   Sodium 141  135 - 145 mEq/L   Potassium 4.1  3.5 - 5.1 mEq/L   Chloride 104  96 - 112 mEq/L   BUN 16  6 - 23 mg/dL   Creatinine, Ser 7.82  0.50 - 1.35 mg/dL   Glucose, Bld 956 (*) 70 - 99 mg/dL   Calcium, Ion 2.13  0.86 - 1.30 mmol/L   TCO2 23  0 - 100 mmol/L   Hemoglobin 14.3  13.0 - 17.0 g/dL   HCT 57.8  46.9 - 62.9 %   No results found.  MDM  Case discussed with Dr. Gordy Levan, patient had thrombocytopenia with platelet count of 1 14,000 June 2013. Plan prescription meclizine followup at office, blood pressure recheck vertigo is felt to be peripheral in etiology . Diagnosis #1 vertigo  #2 thrombocytopenia   #3 hypertension         Doug Sou, MD 01/02/12 1340

## 2012-01-03 ENCOUNTER — Ambulatory Visit: Payer: Medicare Other | Attending: Internal Medicine | Admitting: Physical Therapy

## 2012-01-03 DIAGNOSIS — IMO0001 Reserved for inherently not codable concepts without codable children: Secondary | ICD-10-CM | POA: Insufficient documentation

## 2012-01-03 DIAGNOSIS — H8110 Benign paroxysmal vertigo, unspecified ear: Secondary | ICD-10-CM | POA: Insufficient documentation

## 2012-01-03 DIAGNOSIS — R269 Unspecified abnormalities of gait and mobility: Secondary | ICD-10-CM | POA: Insufficient documentation

## 2012-01-12 ENCOUNTER — Ambulatory Visit: Payer: Medicare Other | Admitting: Physical Therapy

## 2012-01-18 ENCOUNTER — Ambulatory Visit: Payer: Medicare Other | Admitting: Physical Therapy

## 2012-01-19 ENCOUNTER — Encounter: Payer: MEDICARE | Admitting: Physical Therapy

## 2012-01-20 ENCOUNTER — Encounter: Payer: MEDICARE | Admitting: Physical Therapy

## 2012-01-24 ENCOUNTER — Ambulatory Visit: Payer: Medicare Other | Admitting: Physical Therapy

## 2012-01-30 ENCOUNTER — Ambulatory Visit: Payer: Medicare Other | Attending: Internal Medicine | Admitting: Physical Therapy

## 2012-01-30 DIAGNOSIS — H8110 Benign paroxysmal vertigo, unspecified ear: Secondary | ICD-10-CM | POA: Insufficient documentation

## 2012-01-30 DIAGNOSIS — IMO0001 Reserved for inherently not codable concepts without codable children: Secondary | ICD-10-CM | POA: Insufficient documentation

## 2012-01-30 DIAGNOSIS — R269 Unspecified abnormalities of gait and mobility: Secondary | ICD-10-CM | POA: Insufficient documentation

## 2012-02-03 ENCOUNTER — Ambulatory Visit: Payer: MEDICARE | Admitting: Physical Therapy

## 2012-02-14 ENCOUNTER — Encounter: Payer: MEDICARE | Admitting: Physical Therapy

## 2012-02-16 ENCOUNTER — Ambulatory Visit: Payer: Medicare Other | Admitting: Physical Therapy

## 2012-02-21 ENCOUNTER — Encounter: Payer: MEDICARE | Admitting: Physical Therapy

## 2012-02-23 ENCOUNTER — Encounter: Payer: MEDICARE | Admitting: Physical Therapy

## 2012-02-28 ENCOUNTER — Encounter: Payer: MEDICARE | Admitting: Physical Therapy

## 2012-03-01 ENCOUNTER — Encounter: Payer: Self-pay | Admitting: Cardiology

## 2012-03-01 ENCOUNTER — Encounter: Payer: MEDICARE | Admitting: Physical Therapy

## 2012-03-06 ENCOUNTER — Encounter: Payer: MEDICARE | Admitting: Physical Therapy

## 2012-03-08 ENCOUNTER — Encounter: Payer: MEDICARE | Admitting: Physical Therapy

## 2012-09-20 ENCOUNTER — Other Ambulatory Visit: Payer: Self-pay | Admitting: *Deleted

## 2012-09-20 DIAGNOSIS — E1065 Type 1 diabetes mellitus with hyperglycemia: Secondary | ICD-10-CM

## 2012-09-20 DIAGNOSIS — E669 Obesity, unspecified: Secondary | ICD-10-CM

## 2012-10-03 ENCOUNTER — Encounter: Payer: Self-pay | Admitting: Cardiology

## 2012-10-03 ENCOUNTER — Ambulatory Visit (INDEPENDENT_AMBULATORY_CARE_PROVIDER_SITE_OTHER): Payer: MEDICARE | Admitting: Internal Medicine

## 2012-10-03 ENCOUNTER — Ambulatory Visit: Payer: Self-pay | Admitting: Internal Medicine

## 2012-10-03 ENCOUNTER — Encounter: Payer: Self-pay | Admitting: Internal Medicine

## 2012-10-03 VITALS — BP 148/82 | HR 66 | Temp 98.1°F | Resp 16 | Ht 73.0 in | Wt 227.6 lb

## 2012-10-03 DIAGNOSIS — I10 Essential (primary) hypertension: Secondary | ICD-10-CM

## 2012-10-03 DIAGNOSIS — E1039 Type 1 diabetes mellitus with other diabetic ophthalmic complication: Secondary | ICD-10-CM

## 2012-10-03 DIAGNOSIS — E669 Obesity, unspecified: Secondary | ICD-10-CM

## 2012-10-03 MED ORDER — INSULIN ASPART PROT & ASPART (70-30 MIX) 100 UNIT/ML ~~LOC~~ SUSP: SUBCUTANEOUS | Status: AC

## 2012-10-03 MED ORDER — TORSEMIDE 20 MG PO TABS: 20.0000 mg | ORAL_TABLET | ORAL | Status: DC

## 2012-10-03 NOTE — Patient Instructions (Signed)
Continue current medication. Return as scheduled in June.

## 2012-10-03 NOTE — Addendum Note (Signed)
Addended by: Conception Chancy R on: 10/03/2012 12:36 PM   Modules accepted: Medications

## 2012-10-04 ENCOUNTER — Other Ambulatory Visit: Payer: Self-pay | Admitting: *Deleted

## 2012-10-08 ENCOUNTER — Encounter: Payer: Self-pay | Admitting: Internal Medicine

## 2012-10-08 ENCOUNTER — Other Ambulatory Visit: Payer: Self-pay | Admitting: *Deleted

## 2012-10-08 DIAGNOSIS — I10 Essential (primary) hypertension: Secondary | ICD-10-CM | POA: Insufficient documentation

## 2012-10-08 DIAGNOSIS — E669 Obesity, unspecified: Secondary | ICD-10-CM | POA: Insufficient documentation

## 2012-10-08 DIAGNOSIS — E1139 Type 2 diabetes mellitus with other diabetic ophthalmic complication: Secondary | ICD-10-CM | POA: Insufficient documentation

## 2012-10-08 MED ORDER — DIGOXIN 250 MCG PO TABS: 250.0000 ug | ORAL_TABLET | Freq: Every day | ORAL | Status: DC

## 2012-10-08 MED ORDER — DIGOXIN 250 MCG PO TABS: 250.0000 ug | ORAL_TABLET | Freq: Every day | ORAL | Status: AC

## 2012-10-08 NOTE — Progress Notes (Signed)
  Subjective:    Patient ID: Logan Sharp, male    DOB: [birth year], 79 y.o.   MRN: [id number]  HPI Patient is here for routine visit to review chronic conditions. Hypertension is under reasonably good control. There are modest elevations in the SBP. He has no accompanying symptoms. Diabetic control is good Obesity continues to be a problem. Patient, despite a reasonably good diet, continues to have problems losing weight. He maintains his current weight.   Review of Systems  Constitutional: Negative.   HENT: Positive for hearing loss.   Eyes: Negative.        Wears prescription lenses.  Respiratory: Negative.   Cardiovascular: Positive for leg swelling. Negative for chest pain and palpitations.  Gastrointestinal: Negative.   Endocrine: Negative.        Patient is diabetic.  Genitourinary: Negative.   Musculoskeletal: Positive for myalgias, back pain, arthralgias and gait problem. Negative for joint swelling.  Skin: Negative.   Allergic/Immunologic: Negative.   Neurological: Negative.   Hematological: Negative.   Psychiatric/Behavioral:       Chronic anxiety. Mild depression.       Objective:   Physical Exam  Constitutional: He is oriented to person, place, and time.  Moderately obese. No acute distress.  HENT:  Head: Normocephalic.  Mild hearing loss.  Eyes: Conjunctivae and EOM are normal. Pupils are equal, round, and reactive to light.  Neck: Normal range of motion. Neck supple.  Cardiovascular: Normal rate, regular rhythm, normal heart sounds and intact distal pulses.  Exam reveals no gallop and no friction rub.   No murmur heard. Pulmonary/Chest: Effort normal.  Abdominal: Soft. Bowel sounds are normal.  Musculoskeletal: He exhibits edema. He exhibits no tenderness.  Neurological: He is alert and oriented to person, place, and time. No cranial nerve deficit. Coordination normal.  Skin: Skin is warm and dry. No rash noted.  Psychiatric: He has a normal mood and  affect. His behavior is normal. Judgment and thought content normal.          Assessment & Plan:   Patient Active Problem List  Diagnosis  . Unspecified essential hypertension: Continue current medications   . 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 Obesity, unspecified: Dietary compliance stressed   Patient will return in May 2014 scheduled. Lab work has been ordered prior to this visit.

## 2012-10-09 ENCOUNTER — Ambulatory Visit: Payer: Self-pay | Admitting: Internal Medicine

## 2012-10-10 ENCOUNTER — Ambulatory Visit: Payer: Self-pay | Admitting: Internal Medicine

## 2012-11-23 ENCOUNTER — Other Ambulatory Visit: Payer: Self-pay | Admitting: *Deleted

## 2012-11-23 ENCOUNTER — Other Ambulatory Visit: Payer: Medicare Other

## 2012-11-23 DIAGNOSIS — E669 Obesity, unspecified: Secondary | ICD-10-CM

## 2012-11-23 DIAGNOSIS — E1065 Type 1 diabetes mellitus with hyperglycemia: Secondary | ICD-10-CM

## 2012-11-23 MED ORDER — TORSEMIDE 20 MG PO TABS: 20.0000 mg | ORAL_TABLET | ORAL | Status: DC

## 2012-11-24 LAB — COMPREHENSIVE METABOLIC PANEL
AST: 22 IU/L (ref 0–40)
Albumin/Globulin Ratio: 1.2 (ref 1.1–2.5)
Alkaline Phosphatase: 174 IU/L — ABNORMAL HIGH (ref 39–117)
BUN/Creatinine Ratio: 19 (ref 10–22)
Creatinine, Ser: 1.19 mg/dL (ref 0.76–1.27)
GFR calc non Af Amer: 55 mL/min/{1.73_m2} — ABNORMAL LOW (ref >59)
Globulin, Total: 3.3 g/dL (ref 1.5–4.5)
Sodium: 139 mmol/L (ref 134–144)
Total Bilirubin: 0.9 mg/dL (ref 0.0–1.2)

## 2012-11-24 LAB — LIPID PANEL: Chol/HDL Ratio: 3 ratio units (ref 0.0–5.0)

## 2012-11-24 LAB — HEMOGLOBIN A1C: Hgb A1c MFr Bld: 8.2 % — ABNORMAL HIGH (ref 4.8–5.6)

## 2012-11-26 ENCOUNTER — Encounter: Payer: Self-pay | Admitting: *Deleted

## 2012-11-27 ENCOUNTER — Ambulatory Visit (INDEPENDENT_AMBULATORY_CARE_PROVIDER_SITE_OTHER): Payer: Medicare Other | Admitting: Internal Medicine

## 2012-11-27 ENCOUNTER — Encounter: Payer: Self-pay | Admitting: Internal Medicine

## 2012-11-27 VITALS — BP 122/58 | HR 64 | Temp 96.8°F | Resp 16 | Ht 73.0 in | Wt 192.0 lb

## 2012-11-27 DIAGNOSIS — E1039 Type 1 diabetes mellitus with other diabetic ophthalmic complication: Secondary | ICD-10-CM

## 2012-11-27 DIAGNOSIS — I10 Essential (primary) hypertension: Secondary | ICD-10-CM

## 2012-11-27 DIAGNOSIS — I509 Heart failure, unspecified: Secondary | ICD-10-CM

## 2012-11-27 DIAGNOSIS — E1065 Type 1 diabetes mellitus with hyperglycemia: Secondary | ICD-10-CM

## 2012-11-27 DIAGNOSIS — E669 Obesity, unspecified: Secondary | ICD-10-CM

## 2012-11-27 DIAGNOSIS — R609 Edema, unspecified: Secondary | ICD-10-CM

## 2012-11-27 NOTE — Patient Instructions (Signed)
Continue current medications. 

## 2012-11-27 NOTE — Progress Notes (Signed)
Subjective:    Patient ID: Logan Sharp, male    DOB: [birth year], 79 y.o.   MRN: [id number]  HPI Hit right lower leg on on metal cabinet and tore skin on the lateral leg. No infection  DM is doing a little better.  BP is controlled.  The patient has a history of congestive heart failure. He has been on digoxin to help manage this. He was told by a [place] physician that this drug could become toxic to him. He worries about his.    Review of Systems  Constitutional: Negative.   HENT: Positive for hearing loss.   Eyes: Negative.        Wears prescription lenses.  Respiratory: Negative.   Cardiovascular: Positive for leg swelling. Negative for chest pain and palpitations.  Gastrointestinal: Negative.   Endocrine: Negative.        Patient is diabetic.  Genitourinary: Negative.   Musculoskeletal: Positive for myalgias, back pain, arthralgias and gait problem. Negative for joint swelling.  Skin: Negative.   Allergic/Immunologic: Negative.   Neurological: Negative.   Hematological: Negative.   Psychiatric/Behavioral:       Chronic anxiety. Mild depression.       Objective: BP 122/58  Pulse 64  Temp(Src) 96.8 F (36 C)  Resp 16  Ht 6\' 1"  (1.854 m)  Wt 192 lb (87.091 kg)  BMI 25.34 kg/m2    Physical Exam  Constitutional: He is oriented to person, place, and time.  Moderately obese. No acute distress.  HENT:  Head: Normocephalic.  Mild hearing loss.  Eyes: Conjunctivae and EOM are normal. Pupils are equal, round, and reactive to light.  Neck: Normal range of motion. Neck supple.  Cardiovascular: Normal rate, regular rhythm, normal heart sounds and intact distal pulses.  Exam reveals no gallop and no friction rub.   No murmur heard. Pulmonary/Chest: Effort normal.  Abdominal: Soft. Bowel sounds are normal.  Musculoskeletal: He exhibits edema. He exhibits no tenderness.  Neurological: He is alert and oriented to person, place, and time. No cranial nerve deficit.  Coordination normal.  Skin: Skin is warm and dry. No rash noted.  Skin tear of the right lateral lower leg.  Psychiatric: He has a normal mood and affect. His behavior is normal. Judgment and thought content normal.    Lab reports 12/15/2011 CBC Wbc 5.8 Rbc 4.55 Hemoglobin 14.0  CMP Glucose 105 Bun 24 Creatinine 1.02  HA1C 7.5  Lipid Panel Cholesterol 133 Triglycerides 46 Hdl 44 Ldl 80  TSH 3.300 01/02/2012 [place] labs  Sodium 135, Potassium 3.9, glucose 139, BUN 17, Creatinine 0.90, Alkaline Phosphatase 148 CBC: Wbc 7.9, Rbc 4.57, Hgb 14.3, Hct 41.2, Platelets 94 01/02/2012 [facility] EKG slightly tachycardic in comparison to tracing of 09/19/2011 otherwise no significant  change. Nonspecific T wave changes. RBBB 04/20/2012 BMP Glucose 139 Bun 23 Creatinine 0.96  HA1C 8.2  07/30/2012  BMP: glucose 117, BUN 26, Creatinine 1.18 A1C: 8.8 Lipid: Cholesterol 127, Triglycerides 44, HDL 43, LDL 75  Appointment on 11/23/2012  Component Date Value Range Status  . Hemoglobin A1C 11/23/2012 8.2* 4.8 - 5.6 % Final   Comment:          Increased risk for diabetes: 5.7 - 6.4                                   Diabetes: >6.4  Glycemic control for adults with diabetes: <7.0  . Estimated average glucose 11/23/2012 189   Final  . Glucose 11/23/2012 102* 65 - 99 mg/dL Final  . BUN 56/21/3086 23  8 - 27 mg/dL Final  . Creatinine, Ser 11/23/2012 1.19  0.76 - 1.27 mg/dL Final  . GFR calc non Af Amer 11/23/2012 55* >59 mL/min/1.73 Final  . GFR calc Af Amer 11/23/2012 64  >59 mL/min/1.73 Final  . BUN/Creatinine Ratio 11/23/2012 19  10 - 22 Final  . Sodium 11/23/2012 139  134 - 144 mmol/L Final  . Potassium 11/23/2012 4.0  3.5 - 5.2 mmol/L Final  . Chloride 11/23/2012 99  97 - 108 mmol/L Final  . CO2 11/23/2012 24  19 - 28 mmol/L Final  . Calcium 11/23/2012 10.3* 8.6 - 10.2 mg/dL Final  . Total Protein 11/23/2012 7.4  6.0 - 8.5 g/dL Final  . Albumin 57/84/6962 4.1  3.5 - 4.7 g/dL  Final  . Globulin, Total 11/23/2012 3.3  1.5 - 4.5 g/dL Final  . Albumin/Globulin Ratio 11/23/2012 1.2  1.1 - 2.5 Final  . Total Bilirubin 11/23/2012 0.9  0.0 - 1.2 mg/dL Final  . Alkaline Phosphatase 11/23/2012 174* 39 - 117 IU/L Final  . AST 11/23/2012 22  0 - 40 IU/L Final  . ALT 11/23/2012 15  0 - 44 IU/L Final  . Cholesterol, Total 11/23/2012 128  100 - 199 mg/dL Final  . Triglycerides 11/23/2012 60  0 - 149 mg/dL Final  . HDL 95/28/4132 43  >39 mg/dL Final   Comment: According to ATP-III Guidelines, HDL-C >59 mg/dL is considered a                          negative risk factor for CHD.  Marland Kitchen VLDL Cholesterol Cal 11/23/2012 12  5 - 40 mg/dL Final  . LDL Calculated 11/23/2012 73  0 - 99 mg/dL Final  . Chol/HDL Ratio 11/23/2012 3.0  0.0 - 5.0 ratio units Final         Assessment & Plan:  1. Unspecified essential hypertension Occasional elevation of systolic blood pressure. t I believe control is satisfactory  2. 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 - Basic Metabolic Panel - Hemoglobin A1c  3. Obesity, unspecified Patient has been losing weight.  4. Edema Improved but still present. Patient is concerned about the persistence of this problem. Patient only uses his torsemide about 3 days per week. We advised him to begin using a daily to see if it would help the edema. If the swelling goes down, he may begin skipping days.  5. Congestive heart failure, unspecified Under control. - Digoxin level

## 2012-12-02 ENCOUNTER — Encounter: Payer: Self-pay | Admitting: Internal Medicine

## 2012-12-13 ENCOUNTER — Encounter: Payer: Self-pay | Admitting: Internal Medicine

## 2012-12-13 ENCOUNTER — Ambulatory Visit (INDEPENDENT_AMBULATORY_CARE_PROVIDER_SITE_OTHER): Payer: Medicare Other | Admitting: Internal Medicine

## 2012-12-13 VITALS — BP 160/72 | HR 88 | Temp 98.3°F | Ht 73.0 in | Wt 210.0 lb

## 2012-12-13 DIAGNOSIS — S81009A Unspecified open wound, unspecified knee, initial encounter: Secondary | ICD-10-CM

## 2012-12-13 DIAGNOSIS — S81812A Laceration without foreign body, left lower leg, initial encounter: Secondary | ICD-10-CM

## 2012-12-13 DIAGNOSIS — E1065 Type 1 diabetes mellitus with hyperglycemia: Secondary | ICD-10-CM

## 2012-12-13 DIAGNOSIS — S81809A Unspecified open wound, unspecified lower leg, initial encounter: Secondary | ICD-10-CM

## 2012-12-13 DIAGNOSIS — R634 Abnormal weight loss: Secondary | ICD-10-CM

## 2012-12-13 DIAGNOSIS — E1039 Type 1 diabetes mellitus with other diabetic ophthalmic complication: Secondary | ICD-10-CM

## 2012-12-13 NOTE — Patient Instructions (Signed)
Keep the dressing and wrap on your leg for 3 days--keep dry. You may shower if you wrap the dressed area with saran wrap. Remove after 3 days and redress if area still appears open. Let us know if you have drainage, surrounding redness, increased pain, increased heat in your leg or fever.

## 2012-12-13 NOTE — Progress Notes (Signed)
Patient ID: Logan Sharp, male   DOB: 14-Feb-1924, 79 y.o.   MRN: [id number] Location:  [facility] / [facility]   Allergies  Allergen Reactions  . Darvocet (Propoxyphene-Acetaminophen)   . Daypro (Oxaprozin)   . Effexor (Venlafaxine Hcl)   . Glucosamine Chondroitin Adv (Cholestatin)   . Lorcet Plus (Hydrocodone-Acetaminophen)   . Propulsid (Cisapride)   . Tenormin (Atenolol)   . Wellbutrin (Bupropion)   . Zoloft (Sertraline Hcl)     Chief Complaint  Patient presents with  . Acute Visit    skin tear on left lower leg. On 12/09/12 patient hit his leg on a drawer. It won't stop bleeding, patient on Plavix.    HPI: Patient is a 79 y.o. white male seen in the office today for skin tear on left lower leg that would not stop bleeding.  Notes weight loss--has lost 17 lbs since April.  Does not feel like eating.  Says he was retaining fluid at that time.  2 wks ago he had a weight of 192 in our system--that was wrong and it was actually 212 lbs.  Uses lasix three times per week, sometimes more.  Only thing that is abnormal is his hba1c being 8.2.  Says it has been 8 for years, and he is not changing his lifestyle at this point.  Takes 20/15 of insulin.  101 this am.  Discussed gastroparesis.    Review of Systems:  Review of Systems  Constitutional: Positive for weight loss and malaise/fatigue.  HENT: Negative for congestion.   Eyes: Negative for blurred vision.  Respiratory: Negative for shortness of breath.   Cardiovascular: Negative for chest pain and palpitations.  Gastrointestinal: Negative for abdominal pain and melena.  Genitourinary: Negative for dysuria.  Musculoskeletal:       Hit leg on drawer  Skin: Negative for rash.       Skin tear on leg  Neurological: Negative for loss of consciousness.  Endo/Heme/Allergies:       Diabetes     Past Medical History  Diagnosis Date  . Coronary artery disease   . CHF (congestive heart failure)   .  Hypertension   . 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 Chronic atrial fibrillation   . Mild aortic stenosis   . Hx of transient ischemic attack (TIA)   . Pressure ulcer, buttock(707.05)   . Impacted cerumen   . Pain in joint, ankle and foot   . Enthesopathy of hip region   . Unspecified vitamin D deficiency   . Coronary atherosclerosis of native coronary artery   . Other malaise and fatigue   . Occlusion and stenosis of carotid artery without mention of cerebral infarction   . Abnormality of gait   . Actinic keratosis   . Benign paroxysmal positional vertigo   . Pain in joint, lower leg   . Unspecified venous (peripheral) insufficiency   . Sebaceous cyst   . Obesity, unspecified   . Depressive disorder, not elsewhere classified   . Unspecified hereditary and idiopathic peripheral neuropathy   . External hemorrhoids without mention of complication   . Diverticulosis of colon (without mention of hemorrhage)   . Impotence of organic origin   . Unspecified essential hypertension   . Osteoarthrosis, unspecified whether generalized or localized, lower leg   . Sciatica   . Edema   . Chronic rhinitis   . Congestive heart failure, unspecified   . Effusion of joint, site  unspecified   . Unspecified late effects of cerebrovascular disease   . Other and unspecified hyperlipidemia   . Anxiety state, unspecified   . Dizziness and giddiness   . Benign neoplasm of colon   . Atrial fibrillation   . Anemia, unspecified   . Acquired cyst of kidney   . Right bundle branch block     Past Surgical History  Procedure Laterality Date  . Kidney cyst removal    . Colon surgery      x2 Dr Maryagnes Amos   . Cadiac cath  07/06/2008    DR JORDON  . Circumcision  1987  . Knee arthroscopy  1992    right                                  . Nephrectomy  1992    right partial and excision of cyst  . Aspiration / injection renal cyst  1993  .  Cataract extraction, bilateral  1998 (R) 2000(L)    Dr Nile Riggs  . 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 He has never used smokeless tobacco. He reports that he does not drink alcohol or use illicit drugs.quit drinking 40 years ago also  Family History  Problem Relation Age of Onset  . Heart attack    . Heart disease    . Alcohol abuse Father   . Heart attack Father   . Heart attack Sister   . Heart attack Brother     Medications: Patient's Medications  New Prescriptions   No medications on file  Previous Medications   BUSPIRONE (BUSPAR) 15 MG TABLET    Take one tablet up to two times daily for anxiety   CHOLECALCIFEROL (VITAMIN D) 1000 UNITS TABLET    Take 1,000 Units by mouth daily.   CLOPIDOGREL (PLAVIX) 75 MG TABLET    Take 75 mg by mouth daily.   DIGOXIN (LANOXIN) 0.25 MG TABLET    Take 1 tablet (250 mcg total) by mouth daily.   DOXEPIN (SINEQUAN) 50 MG CAPSULE    Take 50 mg by mouth at bedtime.   INSULIN ASPART PROTAMINE-INSULIN ASPART (NOVOLOG MIX 70/30) (70-30) 100 UNIT/ML INJECTION    Inject 20-25 units before breakfast and 15-20 units before supper to contro diabetes.   LOSARTAN-HYDROCHLOROTHIAZIDE (HYZAAR) 100-25 MG PER TABLET    Take one tablet once daily for blood pressure   TORSEMIDE (DEMADEX) 20 MG TABLET    Take 1 tablet (20 mg total) by mouth every Monday, Wednesday, and Friday. To control edema  Modified Medications   No medications on file  Discontinued Medications   No medications on file     Physical Exam: Filed Vitals:   12/13/12 1201  BP: 160/72  Pulse: 88  Temp: 98.3 F (36.8 C)  TempSrc: Oral  Height: 6\' 1"  (1.854 m)  Weight: 210 lb (95.255 kg)  Physical Exam  Constitutional: No distress.  Cardiovascular: Normal rate, regular rhythm, normal heart sounds and intact distal pulses.   Pulmonary/Chest: Effort normal and breath sounds normal. No respiratory distress.   Abdominal: Soft. Bowel sounds are normal.  Neurological: He is alert.  Skin:  Left shin with large excoriation with small pinpoint area with bleeding--was cleansed with saline, then 3 silver nitrate sticks were needed to stop the bleeding, was dressed with a nonadhesive dressing and  coban     Labs reviewed: Basic Metabolic Panel:  Recent Labs  95/62/13 1037 01/02/12 1244 11/23/12 0904  NA 135 141 139  K 3.9 4.1 4.0  CL 100 104 99  CO2 24  --  24  GLUCOSE 139* 129* 102*  BUN 17 16 23   CREATININE 0.90 1.00 1.19  CALCIUM 10.0  --  10.3*   Liver Function Tests:  Recent Labs  01/02/12 1037 11/23/12 0904  AST 24 22  ALT 25 15  ALKPHOS 148* 174*  BILITOT 1.1 0.9  PROT 7.8 7.4  ALBUMIN 4.1  --   CBC:  Recent Labs  01/02/12 1037 01/02/12 1237 01/02/12 1244  WBC 7.9 8.2  --   HGB 14.3 14.0 14.3  HCT 41.2 41.6 42.0  MCV 90.2 91.6  --   PLT 94* 99*  --    Lipid Panel:  Recent Labs  11/23/12 0904  HDL 43  LDLCALC 73  TRIG 60  CHOLHDL 3.0   Lab Results  Component Value Date   HGBA1C 8.2* 11/23/2012   Assessment/Plan 1. Skin tear of lower leg without complication, left, initial encounter -required silver nitrate sticks to stop pinpoint bleeding -advised if bleeding returns, may need sutures to that area -was then wrapped with coban  2. Loss of weight -seems this was due to diuresis -denies other concerning symptoms at this time  3. 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 Chilton Si as scheduled

## 2013-01-22 ENCOUNTER — Other Ambulatory Visit: Payer: Self-pay | Admitting: Internal Medicine

## 2013-02-15 ENCOUNTER — Other Ambulatory Visit: Payer: Medicare Other

## 2013-02-20 ENCOUNTER — Ambulatory Visit (INDEPENDENT_AMBULATORY_CARE_PROVIDER_SITE_OTHER): Payer: Medicare Other | Admitting: Internal Medicine

## 2013-02-20 ENCOUNTER — Encounter: Payer: Self-pay | Admitting: Internal Medicine

## 2013-02-20 VITALS — BP 162/78 | HR 74 | Temp 98.0°F | Resp 14 | Ht 73.0 in | Wt 221.0 lb

## 2013-02-20 DIAGNOSIS — E669 Obesity, unspecified: Secondary | ICD-10-CM

## 2013-02-20 DIAGNOSIS — I509 Heart failure, unspecified: Secondary | ICD-10-CM

## 2013-02-20 DIAGNOSIS — I10 Essential (primary) hypertension: Secondary | ICD-10-CM

## 2013-02-20 DIAGNOSIS — E1039 Type 1 diabetes mellitus with other diabetic ophthalmic complication: Secondary | ICD-10-CM

## 2013-02-20 DIAGNOSIS — R609 Edema, unspecified: Secondary | ICD-10-CM

## 2013-02-20 DIAGNOSIS — F3289 Other specified depressive episodes: Secondary | ICD-10-CM

## 2013-02-20 DIAGNOSIS — F329 Major depressive disorder, single episode, unspecified: Secondary | ICD-10-CM

## 2013-02-20 MED ORDER — CITALOPRAM HYDROBROMIDE 20 MG PO TABS: ORAL_TABLET | ORAL | Status: DC

## 2013-02-20 MED ORDER — ASPIRIN EC 81 MG PO TBEC: DELAYED_RELEASE_TABLET | ORAL | Status: AC

## 2013-02-20 MED ORDER — TORSEMIDE 10 MG PO TABS: ORAL_TABLET | ORAL | Status: DC

## 2013-02-20 NOTE — Progress Notes (Signed)
Subjective:    Patient ID: Logan Sharp, male    DOB: 1923-10-12, 79 y.o.   MRN: [id number]  HPI  Feels OK when he wakes, but is washed out by by lunch.  He wants to stop Plavix. Objects to ecchymoses.  Unspecified essential : Controlled except for occasional mild systolic blood pressure elevations. Denies headache, palpitations, or chest pain.  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 He is attempting to manage diet better. He continues on current medication.  Obesity, unspecified: Improved  Edema : Worse  Congestive heart failure, unspecified: Stable   Depressive disorder, not elsewhere classified: Worsening     Current Outpatient Prescriptions on File Prior to Visit  Medication Sig Dispense Refill  . busPIRone (BUSPAR) 15 MG tablet Take one tablet up to two times daily for anxiety      . cholecalciferol (VITAMIN D) 1000 UNITS tablet Take 1,000 Units by mouth daily.      . clopidogrel (PLAVIX) 75 MG tablet Take 75 mg by mouth daily.      . digoxin (LANOXIN) 0.25 MG tablet Take 1 tablet (250 mcg total) by mouth daily.  90 tablet  4  . doxepin (SINEQUAN) 50 MG capsule Take 50 mg by mouth at bedtime.      . insulin aspart protamine-insulin aspart (NOVOLOG MIX 70/30) (70-30) 100 UNIT/ML injection Inject 20-25 units before breakfast and 15-20 units before supper to contro diabetes.  15 mL  4  . losartan-hydrochlorothiazide (HYZAAR) 100-25 MG per tablet Take one tablet once daily for blood pressure      . torsemide (DEMADEX) 20 MG tablet TAKE ONE TABLET BY MOUTH MON.,WED. AND FRI.  450 tablet  0   No current facility-administered medications on file prior to visit.    Review of Systems  Constitutional: Negative.   HENT: Positive for hearing loss.   Eyes: Negative.        Wears prescription lenses.  Respiratory: Negative.   Cardiovascular: Positive for leg swelling.  Negative for chest pain and palpitations.  Gastrointestinal: Negative.   Endocrine: Negative.        Patient is diabetic.  Genitourinary: Negative.   Musculoskeletal: Positive for myalgias, back pain, arthralgias and gait problem. Negative for joint swelling.  Skin: Negative.   Allergic/Immunologic: Negative.   Neurological: Negative.   Hematological: Negative.   Psychiatric/Behavioral:       Chronic anxiety. Mild depression.       Objective:BP 162/78  Pulse 74  Temp(Src) 98 F (36.7 C) (Oral)  Resp 14  Ht 6\' 1"  (1.854 m)  Wt 221 lb (100.245 kg)  BMI 29.16 kg/m2    Physical Exam  Constitutional: He is oriented to person, place, and time.  Moderately obese. No acute distress.  HENT:  Head: Normocephalic.  Mild hearing loss.  Eyes: Conjunctivae and EOM are normal. Pupils are equal, round, and reactive to light.  Neck: Normal range of motion. Neck supple.  Cardiovascular: Normal rate, regular rhythm, normal heart sounds and intact distal pulses.  Exam reveals no gallop and no friction rub.   No murmur heard. Pulmonary/Chest: Effort normal.  Abdominal: Soft. Bowel sounds are normal.  Musculoskeletal: He exhibits edema. He exhibits no tenderness.  Neurological: He is alert and oriented to person, place, and time. No cranial nerve deficit. Coordination normal.  Skin: Skin is warm and dry. No rash noted.  Skin tear of the right lateral lower leg.  Psychiatric: He has a  normal mood and affect. His behavior is normal. Judgment and thought content normal.      Appointment on 11/23/2012  Component Date Value Range Status  . Hemoglobin A1C 11/23/2012 8.2* 4.8 - 5.6 % Final   Comment:          Increased risk for diabetes: 5.7 - 6.4                                   Diabetes: >6.4                                   Glycemic control for adults with diabetes: <7.0  . Estimated average glucose 11/23/2012 189   Final  . Glucose 11/23/2012 102* 65 - 99 mg/dL Final  . BUN 16/03/9603  23  8 - 27 mg/dL Final  . Creatinine, Ser 11/23/2012 1.19  0.76 - 1.27 mg/dL Final  . GFR calc non Af Amer 11/23/2012 55* >59 mL/min/1.73 Final  . GFR calc Af Amer 11/23/2012 64  >59 mL/min/1.73 Final  . BUN/Creatinine Ratio 11/23/2012 19  10 - 22 Final  . Sodium 11/23/2012 139  134 - 144 mmol/L Final  . Potassium 11/23/2012 4.0  3.5 - 5.2 mmol/L Final  . Chloride 11/23/2012 99  97 - 108 mmol/L Final  . CO2 11/23/2012 24  19 - 28 mmol/L Final  . Calcium 11/23/2012 10.3* 8.6 - 10.2 mg/dL Final  . Total Protein 11/23/2012 7.4  6.0 - 8.5 g/dL Final  . Albumin 54/02/8118 4.1  3.5 - 4.7 g/dL Final  . Globulin, Total 11/23/2012 3.3  1.5 - 4.5 g/dL Final  . Albumin/Globulin Ratio 11/23/2012 1.2  1.1 - 2.5 Final  . Total Bilirubin 11/23/2012 0.9  0.0 - 1.2 mg/dL Final  . Alkaline Phosphatase 11/23/2012 174* 39 - 117 IU/L Final  . AST 11/23/2012 22  0 - 40 IU/L Final  . ALT 11/23/2012 15  0 - 44 IU/L Final  . Cholesterol, Total 11/23/2012 128  100 - 199 mg/dL Final  . Triglycerides 11/23/2012 60  0 - 149 mg/dL Final  . HDL 14/78/2956 43  >39 mg/dL Final   Comment: According to ATP-III Guidelines, HDL-C >59 mg/dL is considered a                          negative risk factor for CHD.  Marland Kitchen VLDL Cholesterol Cal 11/23/2012 12  5 - 40 mg/dL Final  . LDL Calculated 11/23/2012 73  0 - 99 mg/dL Final  . 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

## 2013-02-20 NOTE — Patient Instructions (Signed)
Stop clopidogrel (Plavix) and Sinequan (doxepin).

## 2013-03-24 DIAGNOSIS — F329 Major depressive disorder, single episode, unspecified: Secondary | ICD-10-CM | POA: Insufficient documentation

## 2013-03-29 ENCOUNTER — Other Ambulatory Visit: Payer: Medicare Other

## 2013-03-29 ENCOUNTER — Other Ambulatory Visit: Payer: Self-pay | Admitting: *Deleted

## 2013-03-29 DIAGNOSIS — I2510 Atherosclerotic heart disease of native coronary artery without angina pectoris: Secondary | ICD-10-CM

## 2013-03-29 DIAGNOSIS — I509 Heart failure, unspecified: Secondary | ICD-10-CM

## 2013-03-29 DIAGNOSIS — E1039 Type 1 diabetes mellitus with other diabetic ophthalmic complication: Secondary | ICD-10-CM

## 2013-03-29 DIAGNOSIS — I10 Essential (primary) hypertension: Secondary | ICD-10-CM

## 2013-03-29 DIAGNOSIS — I4510 Unspecified right bundle-branch block: Secondary | ICD-10-CM

## 2013-03-29 DIAGNOSIS — D649 Anemia, unspecified: Secondary | ICD-10-CM

## 2013-03-30 LAB — LIPID PANEL
Chol/HDL Ratio: 2.6 ratio units (ref 0.0–5.0)
HDL: 49 mg/dL (ref >39)
Triglycerides: 53 mg/dL (ref 0–149)

## 2013-03-30 LAB — CBC WITH DIFFERENTIAL/PLATELET
Basophils Absolute: 0 10*3/uL (ref 0.0–0.2)
Eosinophils Absolute: 0.1 10*3/uL (ref 0.0–0.4)
HCT: 40.5 % (ref 37.5–51.0)
Immature Granulocytes: 0 %
Lymphocytes Absolute: 2.2 10*3/uL (ref 0.7–3.1)
MCHC: 34.3 g/dL (ref 31.5–35.7)
Monocytes Absolute: 0.7 10*3/uL (ref 0.1–0.9)
Monocytes: 12 %
RDW: 13.7 % (ref 12.3–15.4)

## 2013-03-30 LAB — COMPREHENSIVE METABOLIC PANEL
AST: 26 IU/L (ref 0–40)
Albumin: 4.3 g/dL (ref 3.5–4.7)
BUN: 21 mg/dL (ref 8–27)
CO2: 24 mmol/L (ref 18–29)
Calcium: 10.1 mg/dL (ref 8.6–10.2)
Creatinine, Ser: 1.13 mg/dL (ref 0.76–1.27)
Globulin, Total: 3 g/dL (ref 1.5–4.5)
Glucose: 96 mg/dL (ref 65–99)
Sodium: 141 mmol/L (ref 134–144)

## 2013-03-30 LAB — DIGOXIN LEVEL: Digoxin Level: 0.5 ng/mL — ABNORMAL LOW (ref 0.9–2.0)

## 2013-03-30 LAB — HEMOGLOBIN A1C: Est. average glucose Bld gHb Est-mCnc: 177 mg/dL

## 2013-04-02 ENCOUNTER — Ambulatory Visit: Payer: Medicare Other | Admitting: Internal Medicine

## 2013-04-02 ENCOUNTER — Encounter: Payer: Self-pay | Admitting: Internal Medicine

## 2013-04-02 ENCOUNTER — Ambulatory Visit (INDEPENDENT_AMBULATORY_CARE_PROVIDER_SITE_OTHER): Payer: Medicare Other | Admitting: Internal Medicine

## 2013-04-02 VITALS — BP 148/72 | HR 83 | Temp 98.2°F | Wt 216.0 lb

## 2013-04-02 DIAGNOSIS — R609 Edema, unspecified: Secondary | ICD-10-CM

## 2013-04-02 DIAGNOSIS — E1139 Type 2 diabetes mellitus with other diabetic ophthalmic complication: Secondary | ICD-10-CM

## 2013-04-02 DIAGNOSIS — F329 Major depressive disorder, single episode, unspecified: Secondary | ICD-10-CM

## 2013-04-02 DIAGNOSIS — E785 Hyperlipidemia, unspecified: Secondary | ICD-10-CM

## 2013-04-02 DIAGNOSIS — Z23 Encounter for immunization: Secondary | ICD-10-CM

## 2013-04-02 DIAGNOSIS — I10 Essential (primary) hypertension: Secondary | ICD-10-CM

## 2013-04-02 DIAGNOSIS — E669 Obesity, unspecified: Secondary | ICD-10-CM

## 2013-04-02 MED ORDER — BUSPIRONE HCL 15 MG PO TABS: ORAL_TABLET | ORAL | Status: DC

## 2013-04-02 MED ORDER — TORSEMIDE 10 MG PO TABS: ORAL_TABLET | ORAL | Status: DC

## 2013-04-02 MED ORDER — LOSARTAN POTASSIUM-HCTZ 100-25 MG PO TABS: ORAL_TABLET | ORAL | Status: DC

## 2013-04-02 NOTE — Progress Notes (Signed)
Subjective:    Patient ID: Logan Sharp, male    DOB: 1924-04-13, 79 y.o.   MRN: [id number]  Chief Complaint  Patient presents with  . Medical Managment of Chronic Issues    3 month follow-up, discuss labs (copy printed)     HPI  Type II or unspecified type diabetes mellitus with ophthalmic manifestations, uncontrolled(250.52): improving.  Unspecified essential hypertension: stable  Obesity, unspecified: losing weight  Depressive disorder, not elsewhere classified: only took one citalopram. Claims it gave him diarrhea. He resumed taking Doxepin. He is not using buspirone. Claims it made him short of breath. Feeling old and tired.  Edema - taking torsemide (DEMADEX) 10 MG tablet daily. Continues to have pedal edema, but weight dropped 5 #.  Getting a front tooth replaced with a bridge next week. Wants to quit ASA a few days before.  Need for prophylactic vaccination and inoculation against influenza    Current Outpatient Prescriptions on File Prior to Visit  Medication Sig Dispense Refill  . aspirin EC 81 MG tablet One daily to prevent stroke or heart attack.  100 tablet  5  . cholecalciferol (VITAMIN D) 1000 UNITS tablet Take 1,000 Units by mouth daily.      . digoxin (LANOXIN) 0.25 MG tablet Take 1 tablet (250 mcg total) by mouth daily.  90 tablet  4  . insulin aspart protamine-insulin aspart (NOVOLOG MIX 70/30) (70-30) 100 UNIT/ML injection Inject 20-25 units before breakfast and 15-20 units before supper to contro diabetes.  15 mL  4  . losartan-hydrochlorothiazide (HYZAAR) 100-25 MG per tablet Take one tablet once daily for blood pressure      . torsemide (DEMADEX) 10 MG tablet One daily to reduce edema  90 tablet  3   No current facility-administered medications on file prior to visit.    Review of Systems  Constitutional: Negative.   HENT: Positive for hearing loss.   Eyes: Negative.        Wears prescription lenses.  Respiratory: Negative.   Cardiovascular:  Positive for leg swelling. Negative for chest pain and palpitations.  Gastrointestinal: Negative.   Endocrine: Negative.        Patient is diabetic.  Genitourinary: Negative.   Musculoskeletal: Positive for myalgias, back pain, arthralgias and gait problem. Negative for joint swelling.  Skin: Negative.   Allergic/Immunologic: Negative.   Neurological: Negative.   Hematological: Negative.   Psychiatric/Behavioral:       Chronic anxiety. Mild depression.       Objective:   Physical Exam  Constitutional: He is oriented to person, place, and time.  Moderately obese. No acute distress.  HENT:  Head: Normocephalic.  Mild hearing loss.  Eyes: Conjunctivae and EOM are normal. Pupils are equal, round, and reactive to light.  Neck: Normal range of motion. Neck supple.  Cardiovascular: Normal rate, regular rhythm, normal heart sounds and intact distal pulses.  Exam reveals no gallop and no friction rub.   No murmur heard. Pulmonary/Chest: Effort normal.  Abdominal: Soft. Bowel sounds are normal.  Musculoskeletal: He exhibits edema. He exhibits no tenderness.  Neurological: He is alert and oriented to person, place, and time. No cranial nerve deficit. Coordination normal.  Skin: Skin is warm and dry. No rash noted.  Skin tear of the right lateral lower leg.  Psychiatric: He has a normal mood and affect. His behavior is normal. Judgment and thought content normal.     Appointment on 03/29/2013  Component Date Value Range Status  . Digoxin Level 03/29/2013 0.5*  0.9 - 2.0 ng/mL Final   Comment: Concentrations above 2.0 ng/mL are generally considered                          toxic. Some overlap of toxic and non-toxic values have                          been reported.                                                      Detection Limit = 0.2 ng/mL  . Glucose 03/29/2013 96  65 - 99 mg/dL Final  . BUN 16/03/9603 21  8 - 27 mg/dL Final  . Creatinine, Ser 03/29/2013 1.13  0.76 - 1.27 mg/dL  Final  . GFR calc non Af Amer 03/29/2013 59* >59 mL/min/1.73 Final  . GFR calc Af Amer 03/29/2013 68  >59 mL/min/1.73 Final  . BUN/Creatinine Ratio 03/29/2013 19  10 - 22 Final  . Sodium 03/29/2013 141  134 - 144 mmol/L Final  . Potassium 03/29/2013 4.2  3.5 - 5.2 mmol/L Final  . Chloride 03/29/2013 99  97 - 108 mmol/L Final  . CO2 03/29/2013 24  18 - 29 mmol/L Final  . Calcium 03/29/2013 10.1  8.6 - 10.2 mg/dL Final  . Total Protein 03/29/2013 7.3  6.0 - 8.5 g/dL Final  . Albumin 54/02/8118 4.3  3.5 - 4.7 g/dL Final  . Globulin, Total 03/29/2013 3.0  1.5 - 4.5 g/dL Final  . Albumin/Globulin Ratio 03/29/2013 1.4  1.1 - 2.5 Final  . Total Bilirubin 03/29/2013 0.9  0.0 - 1.2 mg/dL Final  . Alkaline Phosphatase 03/29/2013 147* 39 - 117 IU/L Final  . AST 03/29/2013 26  0 - 40 IU/L Final  . ALT 03/29/2013 19  0 - 44 IU/L Final  . Cholesterol, Total 03/29/2013 129  100 - 199 mg/dL Final  . Triglycerides 03/29/2013 53  0 - 149 mg/dL Final  . HDL 14/78/2956 49  >39 mg/dL Final   Comment: According to ATP-III Guidelines, HDL-C >59 mg/dL is considered a                          negative risk factor for CHD.  Marland Kitchen VLDL Cholesterol Cal 03/29/2013 11  5 - 40 mg/dL Final  . LDL Calculated 03/29/2013 69  0 - 99 mg/dL Final  . Chol/HDL Ratio 03/29/2013 2.6  0.0 - 5.0 ratio units Final   Comment:                                   T. Chol/HDL Ratio                                                                      Men  Women  1/2 Avg.Risk  3.4    3.3                                                            Avg.Risk  5.0    4.4                                                         2X Avg.Risk  9.6    7.1                                                         3X Avg.Risk 23.4   11.0  . WBC 03/29/2013 5.8  3.4 - 10.8 x10E3/uL Final  . RBC 03/29/2013 4.44  4.14 - 5.80 x10E6/uL Final  . Hemoglobin 03/29/2013 13.9  12.6 - 17.7 g/dL Final  . HCT  45/40/9811 40.5  37.5 - 51.0 % Final  . MCV 03/29/2013 91  79 - 97 fL Final  . MCH 03/29/2013 31.3  26.6 - 33.0 pg Final  . MCHC 03/29/2013 34.3  31.5 - 35.7 g/dL Final  . RDW 91/47/8295 13.7  12.3 - 15.4 % Final  . Neutrophils Relative % 03/29/2013 50   Final  . Lymphs 03/29/2013 37   Final  . Monocytes 03/29/2013 12   Final  . Eos 03/29/2013 1   Final  . Basos 03/29/2013 0   Final  . Neutrophils Absolute 03/29/2013 2.9  1.4 - 7.0 x10E3/uL Final  . Lymphocytes Absolute 03/29/2013 2.2  0.7 - 3.1 x10E3/uL Final  . Monocytes Absolute 03/29/2013 0.7  0.1 - 0.9 x10E3/uL Final  . Eosinophils Absolute 03/29/2013 0.1  0.0 - 0.4 x10E3/uL Final  . Basophils Absolute 03/29/2013 0.0  0.0 - 0.2 x10E3/uL Final  . Immature Granulocytes 03/29/2013 0   Final  . Immature Grans (Abs) 03/29/2013 0.0  0.0 - 0.1 x10E3/uL Final  . Hemoglobin A1C 03/29/2013 7.8* 4.8 - 5.6 % Final   Comment:          Increased risk for diabetes: 5.7 - 6.4                                   Diabetes: >6.4                                   Glycemic control for adults with diabetes: <7.0  . Estimated average glucose 03/29/2013 177   Final        Assessment & Plan:  Type II or unspecified type diabetes mellitus with ophthalmic manifestations, uncontrolled(250.52) - Plan: Hemoglobin A1c  Unspecified essential hypertension - Plan: losartan-hydrochlorothiazide (HYZAAR) 100-25 MG per tablet, CMP  Obesity, unspecified: continue to lose weight  Depressive disorder, not elsewhere classified - Plan: busPIRone (BUSPAR) 15 MG tablet  Edema - Plan: torsemide (DEMADEX) 10 MG tablet  Need  for prophylactic vaccination and inoculation against influenza: injx given  Other and unspecified hyperlipidemia - Plan: Lipid panel

## 2013-04-02 NOTE — Patient Instructions (Signed)
Continue medications as directed

## 2013-06-28 ENCOUNTER — Encounter: Payer: Self-pay | Admitting: *Deleted

## 2013-07-01 ENCOUNTER — Other Ambulatory Visit: Payer: Medicare Other

## 2013-07-01 DIAGNOSIS — E1139 Type 2 diabetes mellitus with other diabetic ophthalmic complication: Secondary | ICD-10-CM

## 2013-07-01 DIAGNOSIS — E785 Hyperlipidemia, unspecified: Secondary | ICD-10-CM

## 2013-07-01 DIAGNOSIS — E1165 Type 2 diabetes mellitus with hyperglycemia: Principal | ICD-10-CM

## 2013-07-02 ENCOUNTER — Ambulatory Visit (INDEPENDENT_AMBULATORY_CARE_PROVIDER_SITE_OTHER): Payer: Medicare Other | Admitting: Internal Medicine

## 2013-07-02 ENCOUNTER — Encounter: Payer: Self-pay | Admitting: Internal Medicine

## 2013-07-02 VITALS — BP 150/84 | HR 83 | Temp 97.2°F | Resp 10 | Wt 212.4 lb

## 2013-07-02 DIAGNOSIS — R609 Edema, unspecified: Secondary | ICD-10-CM

## 2013-07-02 DIAGNOSIS — E1139 Type 2 diabetes mellitus with other diabetic ophthalmic complication: Secondary | ICD-10-CM

## 2013-07-02 DIAGNOSIS — E1165 Type 2 diabetes mellitus with hyperglycemia: Principal | ICD-10-CM

## 2013-07-02 DIAGNOSIS — E785 Hyperlipidemia, unspecified: Secondary | ICD-10-CM

## 2013-07-02 DIAGNOSIS — I509 Heart failure, unspecified: Secondary | ICD-10-CM

## 2013-07-02 DIAGNOSIS — I10 Essential (primary) hypertension: Secondary | ICD-10-CM

## 2013-07-02 DIAGNOSIS — E669 Obesity, unspecified: Secondary | ICD-10-CM

## 2013-07-02 DIAGNOSIS — F3289 Other specified depressive episodes: Secondary | ICD-10-CM

## 2013-07-02 DIAGNOSIS — F329 Major depressive disorder, single episode, unspecified: Secondary | ICD-10-CM

## 2013-07-02 LAB — LIPID PANEL
Chol/HDL Ratio: 2.8 ratio units (ref 0.0–5.0)
Cholesterol, Total: 131 mg/dL (ref 100–199)
HDL: 46 mg/dL (ref >39)
LDL CALC: 74 mg/dL (ref 0–99)
Triglycerides: 53 mg/dL (ref 0–149)
VLDL CHOLESTEROL CAL: 11 mg/dL (ref 5–40)

## 2013-07-02 LAB — HEMOGLOBIN A1C
Est. average glucose Bld gHb Est-mCnc: 177 mg/dL
Hgb A1c MFr Bld: 7.8 % — ABNORMAL HIGH (ref 4.8–5.6)

## 2013-07-02 NOTE — Progress Notes (Signed)
Patient ID: Logan Sharp, male   DOB: May 13, 1924, 80 y.o.   MRN: [id number]    Location:    [facility]  Place of Service:  OFFICE    Allergies  Allergen Reactions  . Darvocet [Propoxyphene N-Acetaminophen]   . Daypro [Oxaprozin]   . Effexor [Venlafaxine Hcl]   . Glucosamine Chondroitin Adv [Cholestatin]   . Lorcet Plus [Hydrocodone-Acetaminophen]   . Propulsid [Cisapride]   . Tenormin [Atenolol]   . Wellbutrin [Bupropion]   . Zoloft [Sertraline Hcl]     Chief Complaint  Patient presents with  . 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   CHOLECALCIFEROL (VITAMIN D) 1000 UNITS TABLET    Take 1,000 Units by mouth daily.   DIGOXIN (LANOXIN) 0.25 MG TABLET    Take 1 tablet (250 mcg total) by mouth daily.   INSULIN ASPART PROTAMINE-INSULIN ASPART (NOVOLOG MIX 70/30) (70-30) 100 UNIT/ML INJECTION    Inject 20-25 units before breakfast and 15-20 units before supper to contro diabetes.   LOSARTAN-HYDROCHLOROTHIAZIDE (HYZAAR) 100-25 MG PER TABLET    Take one tablet once daily for blood pressure   TORSEMIDE (DEMADEX) 10 MG TABLET    One daily to reduce edema  Modified Medications   Modified Medication Previous Medication   BUSPIRONE (BUSPAR) 15 MG TABLET busPIRone (BUSPAR) 15 MG tablet      One up to twice daily to help anxiety AS NEEDED ONLY    One up to twice daily to help anxiety  Discontinued Medications   No medications on file      Review of Systems  Constitutional: Negative.   HENT: Positive for hearing loss.   Eyes: Negative.        Wears prescription lenses.  Respiratory: Negative.   Cardiovascular: Positive for leg swelling. Negative for chest pain and palpitations.  Gastrointestinal: Negative.   Endocrine: Negative.        Patient is diabetic.  Genitourinary: Negative.   Musculoskeletal: Positive for arthralgias, back pain, gait problem and myalgias. Negative for joint swelling.  Skin: Negative.   Allergic/Immunologic: Negative.   Neurological: Negative.   Hematological: Negative.   Psychiatric/Behavioral:       Chronic anxiety. Mild depression.    Filed Vitals:   07/02/13 1402  BP: 150/84  Pulse: 83  Temp: 97.2 F (36.2 C)  TempSrc: Oral  Resp: 10  Weight: 212 lb 6.4 oz (96.344 kg)  SpO2: 97%   Physical Exam  Constitutional: He is oriented to person, place, and time.  Moderately obese. No acute distress.  HENT:  Head: Normocephalic.  Mild hearing loss.  Eyes: Conjunctivae and EOM are normal. Pupils are equal, round, and reactive to light.  Neck: Normal range of motion. Neck supple.  Cardiovascular: Normal rate, regular rhythm, normal heart sounds and intact distal pulses.  Exam reveals no gallop and no friction rub.   No murmur heard. Pulmonary/Chest: Effort normal.  Abdominal: Soft. Bowel sounds are normal.  Musculoskeletal: He exhibits edema. He exhibits no  tenderness.  Neurological: He is alert and oriented to person, place, and time. No cranial nerve deficit. Coordination normal.  Skin: Skin is warm and dry. No rash noted.  Psychiatric: He has a normal mood and affect. His behavior is normal. Judgment and thought content normal.     Labs reviewed: Appointment on 07/01/2013  Component Date Value Range Status  . Hemoglobin A1C 07/01/2013 7.8* 4.8 - 5.6 % Final   Comment:          Increased risk for diabetes: 5.7 - 6.4                                   Diabetes: >6.4                                    Glycemic control for adults with diabetes: <7.0  . Estimated average glucose 07/01/2013 177   Final  . Cholesterol, Total 07/01/2013 131  100 - 199 mg/dL Final  . Triglycerides 07/01/2013 53  0 - 149 mg/dL Final  . HDL 56/31/4970 46  >39 mg/dL Final   Comment: According to ATP-III Guidelines, HDL-C >59 mg/dL is considered a                          negative risk factor for CHD.  Marland Kitchen VLDL Cholesterol Cal 07/01/2013 11  5 - 40 mg/dL Final  . LDL Calculated 07/01/2013 74  0 - 99 mg/dL Final  . Chol/HDL Ratio 07/01/2013 2.8  0.0 - 5.0 ratio units Final   Comment:                                   T. Chol/HDL Ratio                                                                      Men  Women                                                        1/2 Avg.Risk  3.4    3.3                                                            Avg.Risk  5.0    4.4                                                         2X Avg.Risk  9.6    7.1  3X Avg.Risk 23.4   11.0  Abstract on 06/28/2013  Component Date Value Range Status  . HM Colonoscopy 03/19/2001 [place] for [facility], polyps repeat ?   Final      Assessment/Plan  1. Type II or unspecified type diabetes mellitus with ophthalmic manifestations, uncontrolled A1c is now < 8.  - Hemoglobin A1c; Future - CMP - Microalbumin/Creatinine Ratio, Urine  2. Unspecified essential hypertension Adequately controlled. Mild elevation in SBP.  3. Edema improved - CMP  4. Congestive heart failure, unspecified controlled  5. Depressive disorder, not elsewhere classified stable  6. Obesity, unspecified Losing weight  7. Other and unspecified hyperlipidemia controlled - Lipid panel; Future

## 2013-07-02 NOTE — Patient Instructions (Signed)
Continue current medications. 

## 2013-07-05 ENCOUNTER — Other Ambulatory Visit: Payer: Medicare Other

## 2013-07-09 ENCOUNTER — Ambulatory Visit: Payer: Medicare Other | Admitting: Internal Medicine

## 2013-11-01 ENCOUNTER — Other Ambulatory Visit: Payer: Medicare Other

## 2013-11-04 ENCOUNTER — Other Ambulatory Visit: Payer: Self-pay | Admitting: Internal Medicine

## 2013-11-04 ENCOUNTER — Other Ambulatory Visit: Payer: Medicare Other

## 2013-11-05 ENCOUNTER — Encounter: Payer: Self-pay | Admitting: Internal Medicine

## 2013-11-05 ENCOUNTER — Ambulatory Visit (INDEPENDENT_AMBULATORY_CARE_PROVIDER_SITE_OTHER): Payer: Medicare Other | Admitting: Internal Medicine

## 2013-11-05 VITALS — BP 157/78 | HR 94 | Temp 98.4°F | Resp 20 | Ht 73.0 in | Wt 211.8 lb

## 2013-11-05 DIAGNOSIS — I509 Heart failure, unspecified: Secondary | ICD-10-CM

## 2013-11-05 DIAGNOSIS — S91009A Unspecified open wound, unspecified ankle, initial encounter: Secondary | ICD-10-CM

## 2013-11-05 DIAGNOSIS — I10 Essential (primary) hypertension: Secondary | ICD-10-CM

## 2013-11-05 DIAGNOSIS — M25561 Pain in right knee: Secondary | ICD-10-CM

## 2013-11-05 DIAGNOSIS — R609 Edema, unspecified: Secondary | ICD-10-CM

## 2013-11-05 DIAGNOSIS — H6120 Impacted cerumen, unspecified ear: Secondary | ICD-10-CM | POA: Insufficient documentation

## 2013-11-05 DIAGNOSIS — E1165 Type 2 diabetes mellitus with hyperglycemia: Principal | ICD-10-CM

## 2013-11-05 DIAGNOSIS — M25569 Pain in unspecified knee: Secondary | ICD-10-CM

## 2013-11-05 DIAGNOSIS — S81009A Unspecified open wound, unspecified knee, initial encounter: Secondary | ICD-10-CM

## 2013-11-05 DIAGNOSIS — E1139 Type 2 diabetes mellitus with other diabetic ophthalmic complication: Secondary | ICD-10-CM

## 2013-11-05 DIAGNOSIS — S81809A Unspecified open wound, unspecified lower leg, initial encounter: Secondary | ICD-10-CM

## 2013-11-05 NOTE — Progress Notes (Signed)
Patient ID: Logan Sharp, male   DOB: 06-Sep-1923, 80 y.o.   MRN: [id number]    Location:  [facility]   Place of Service: OFFICE    Allergies  Allergen Reactions  . Darvocet [Propoxyphene N-Acetaminophen]   . Daypro [Oxaprozin]   . Effexor [Venlafaxine Hcl]   . Glucosamine Chondroitin Adv [Cholestatin]   . Lorcet Plus [Hydrocodone-Acetaminophen]   . Propulsid [Cisapride]   . Tenormin [Atenolol]   . Wellbutrin [Bupropion]   . Zoloft [Sertraline Hcl]     Chief Complaint  Patient presents with  . 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 Small effusion. No recent injury or fall.  Impacted cerumen: tinnitus right ear and loss of hearing bilaterally    Medications: Patient's Medications  New Prescriptions   No medications on file  Previous Medications   ASPIRIN EC 81 MG TABLET    One daily to prevent stroke or heart attack.   BUSPIRONE (BUSPAR) 15 MG TABLET    One up to twice daily to help anxiety AS NEEDED ONLY   CHOLECALCIFEROL (VITAMIN D) 1000 UNITS TABLET    Take 1,000 Units by mouth daily.   DIGOXIN (LANOXIN) 0.25 MG TABLET    Take 1 tablet (250 mcg total) by mouth daily.   INSULIN ASPART PROTAMINE-INSULIN ASPART (NOVOLOG MIX 70/30) (70-30) 100 UNIT/ML INJECTION    Inject 20-25 units before breakfast and 15-20 units before supper to contro diabetes.   LOSARTAN-HYDROCHLOROTHIAZIDE (HYZAAR) 100-25 MG PER TABLET    Take one tablet once daily for blood pressure   TORSEMIDE (DEMADEX) 10 MG TABLET    One daily to reduce edema  Modified Medications   No medications on file  Discontinued Medications   No medications on file     Review of Systems  Constitutional: Negative.   HENT: Positive for hearing loss and tinnitus  (right).   Eyes: Negative.        Wears prescription lenses.  Respiratory: Negative.   Cardiovascular: Positive for leg swelling. Negative for chest pain and palpitations.  Gastrointestinal: Negative.   Endocrine: Negative.        Patient is diabetic.  Genitourinary: Negative.   Musculoskeletal: Positive for arthralgias, back pain, gait problem and myalgias. Negative for joint swelling.       Pain right knee.  Skin: Negative.   Allergic/Immunologic: Negative.   Neurological: Negative.   Hematological: Negative.   Psychiatric/Behavioral:       Chronic anxiety. Mild depression.    Filed Vitals:   11/05/13 1341  BP: 157/78  Pulse: 94  Temp: 98.4 F (36.9 C)  TempSrc: Oral  Resp: 20  Height: 6\' 1"  (1.854 m)  Weight: 211 lb 12.8 oz (96.072 kg)  SpO2: 98%   Body mass index is 27.95 kg/(m^2).  Physical Exam  Constitutional: He is oriented to person, place, and time.  Moderately obese. No acute distress.  HENT:  Head: Normocephalic.  Mild hearing loss.  Eyes: Conjunctivae and EOM are normal. Pupils are equal, round, and reactive to light.  Neck: Normal range of motion. Neck supple.  Cardiovascular: Normal rate, regular rhythm, normal heart sounds and intact distal pulses.  Exam reveals no gallop and no friction rub.   No murmur heard. Pulmonary/Chest: Effort normal.  Abdominal: Soft. Bowel sounds are normal.  Musculoskeletal: He exhibits edema.  He exhibits no tenderness.  Small effusion right knee. Unable to fully extend right knee and lower leg.  Neurological: He is alert and oriented to person, place, and time. No cranial nerve deficit. Coordination normal.  Skin: Skin is warm and dry. No rash noted.  Abrasion of the left lateral malleolus  Psychiatric: He has a normal mood and affect. His behavior is normal. Judgment and thought content normal.     Labs reviewed: No visits with results within 3 Month(s) from this visit. Latest known visit with results  is:  Appointment on 07/01/2013  Component Date Value Ref Range Status  . Hemoglobin A1C 07/01/2013 7.8* 4.8 - 5.6 % Final   Comment:          Increased risk for diabetes: 5.7 - 6.4                                   Diabetes: >6.4                                   Glycemic control for adults with diabetes: <7.0  . Estimated average glucose 07/01/2013 177   Final  . Cholesterol, Total 07/01/2013 131  100 - 199 mg/dL Final  . Triglycerides 07/01/2013 53  0 - 149 mg/dL Final  . HDL 07/01/2013 46  >39 mg/dL Final   Comment: According to ATP-III Guidelines, HDL-C >59 mg/dL is considered a                          negative risk factor for CHD.  Marland Kitchen VLDL Cholesterol Cal 07/01/2013 11  5 - 40 mg/dL Final  . LDL Calculated 07/01/2013 74  0 - 99 mg/dL Final  . Chol/HDL Ratio 07/01/2013 2.8  0.0 - 5.0 ratio units Final   Comment:                                   T. Chol/HDL Ratio                                                                      Men  Women                                                        1/2 Avg.Risk  3.4    3.3                                                            Avg.Risk  5.0    4.4  2X Avg.Risk  9.6    7.1                                                         3X Avg.Risk 23.4   11.0      Assessment/Plan  1. 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 Unspecified essential hypertension controlled  3. Edema unchanged  4. Congestive heart failure, unspecified compensated  5. Pain in right knee Injection: 1cc Kenalog + 2cc lidocaine. Tolerated procedure  6. Impacted cerumen Bilateral lavage  7. Ankle wound Recommend hydrocolloid bandages.

## 2013-11-05 NOTE — Patient Instructions (Addendum)
Continue current medications Come for lab prior to next visit: CMP, A1c, Lipids, Urine microalbumin

## 2013-12-13 ENCOUNTER — Encounter: Payer: Self-pay | Admitting: Internal Medicine

## 2013-12-26 LAB — LIPID PANEL W/O CHOL/HDL RATIO
CHOLESTEROL TOTAL: 118 mg/dL (ref 100–199)
HDL: 46 mg/dL (ref >39)
LDL Calculated: 63 mg/dL (ref 0–99)
TRIGLYCERIDES: 44 mg/dL (ref 0–149)
VLDL Cholesterol Cal: 9 mg/dL (ref 5–40)

## 2013-12-26 LAB — HGB A1C W/O EAG: Hgb A1c MFr Bld: 7.9 % — ABNORMAL HIGH (ref 4.8–5.6)

## 2014-01-17 ENCOUNTER — Encounter (INDEPENDENT_AMBULATORY_CARE_PROVIDER_SITE_OTHER): Payer: Medicare Other | Admitting: Ophthalmology

## 2014-01-17 DIAGNOSIS — E11319 Type 2 diabetes mellitus with unspecified diabetic retinopathy without macular edema: Secondary | ICD-10-CM

## 2014-01-17 DIAGNOSIS — E1165 Type 2 diabetes mellitus with hyperglycemia: Secondary | ICD-10-CM

## 2014-01-17 DIAGNOSIS — E1139 Type 2 diabetes mellitus with other diabetic ophthalmic complication: Secondary | ICD-10-CM

## 2014-01-17 DIAGNOSIS — E11359 Type 2 diabetes mellitus with proliferative diabetic retinopathy without macular edema: Secondary | ICD-10-CM

## 2014-01-17 DIAGNOSIS — H4310 Vitreous hemorrhage, unspecified eye: Secondary | ICD-10-CM

## 2014-01-17 DIAGNOSIS — H3530 Unspecified macular degeneration: Secondary | ICD-10-CM

## 2014-01-17 DIAGNOSIS — I10 Essential (primary) hypertension: Secondary | ICD-10-CM

## 2014-01-17 DIAGNOSIS — H35039 Hypertensive retinopathy, unspecified eye: Secondary | ICD-10-CM

## 2014-01-17 DIAGNOSIS — H43819 Vitreous degeneration, unspecified eye: Secondary | ICD-10-CM

## 2014-01-29 ENCOUNTER — Ambulatory Visit (INDEPENDENT_AMBULATORY_CARE_PROVIDER_SITE_OTHER): Payer: Medicare Other | Admitting: Internal Medicine

## 2014-01-29 ENCOUNTER — Encounter: Payer: Self-pay | Admitting: Internal Medicine

## 2014-01-29 VITALS — BP 156/88 | HR 88 | Temp 98.1°F | Ht 73.0 in | Wt 213.4 lb

## 2014-01-29 DIAGNOSIS — E1139 Type 2 diabetes mellitus with other diabetic ophthalmic complication: Secondary | ICD-10-CM

## 2014-01-29 DIAGNOSIS — R609 Edema, unspecified: Secondary | ICD-10-CM

## 2014-01-29 DIAGNOSIS — I509 Heart failure, unspecified: Secondary | ICD-10-CM

## 2014-01-29 DIAGNOSIS — E1165 Type 2 diabetes mellitus with hyperglycemia: Secondary | ICD-10-CM

## 2014-01-29 DIAGNOSIS — R5381 Other malaise: Secondary | ICD-10-CM | POA: Insufficient documentation

## 2014-01-29 DIAGNOSIS — R0989 Other specified symptoms and signs involving the circulatory and respiratory systems: Secondary | ICD-10-CM

## 2014-01-29 DIAGNOSIS — I10 Essential (primary) hypertension: Secondary | ICD-10-CM

## 2014-01-29 DIAGNOSIS — R0600 Dyspnea, unspecified: Secondary | ICD-10-CM

## 2014-01-29 DIAGNOSIS — R0609 Other forms of dyspnea: Secondary | ICD-10-CM

## 2014-01-29 DIAGNOSIS — R5383 Other fatigue: Principal | ICD-10-CM

## 2014-01-29 NOTE — Progress Notes (Signed)
Patient ID: Logan Sharp, male   DOB: 03-12-1924, 80 y.o.   MRN: [id number]    Location:    [facility]  Place of Service:  OFFICE    Allergies  Allergen Reactions  . Darvocet [Propoxyphene N-Acetaminophen]   . Daypro [Oxaprozin]   . Effexor [Venlafaxine Hcl]   . Glucosamine Chondroitin Adv [Cholestatin]   . Lorcet Plus [Hydrocodone-Acetaminophen]   . Propulsid [Cisapride]   . Tenormin [Atenolol]   . Wellbutrin [Bupropion]   . Zoloft [Sertraline Hcl]     Chief Complaint  Patient presents with  . Acute Visit    Patient not feeling well. Thinks his potassium is low. Feeling fatigued and no energy    HPI:   Other malaise and fatigue: uncertain etiology. Complains of no energy. Sleeps OK. No increase in pain. No fever or chills.  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 Glucose still high many days. No hypoglycemia.  Unspecified essential hypertension: elevated SBP. Tolerating current meds.  Edema: brawny edema of both lower legs. No worse. No pain.  Congestive heart failure, unspecified: controlled. Some dyspnea with exertion.  Dyspnea: on exertion    Medications: Patient's Medications  New Prescriptions   No medications on file  Previous Medications   ASPIRIN EC 81 MG TABLET    One daily to prevent stroke or heart attack.   BUSPIRONE (BUSPAR) 15 MG TABLET    One up to twice daily to help anxiety AS NEEDED ONLY   CHOLECALCIFEROL (VITAMIN D) 1000 UNITS TABLET    Take 1,000 Units by mouth daily.   DIGOXIN (LANOXIN) 0.25 MG TABLET    Take 1 tablet (250 mcg total) by mouth daily.   INSULIN ASPART PROTAMINE-INSULIN ASPART (NOVOLOG MIX 70/30) (70-30) 100 UNIT/ML INJECTION    Inject 20-25 units before breakfast and 15-20 units before supper to contro diabetes.   LOSARTAN-HYDROCHLOROTHIAZIDE (HYZAAR) 100-25 MG PER TABLET    Take one tablet once daily for blood pressure   TORSEMIDE (DEMADEX) 10 MG TABLET     One daily to reduce edema  Modified Medications   No medications on file  Discontinued Medications   No medications on file     Review of Systems  Constitutional: Negative.   HENT: Positive for hearing loss and tinnitus (right).   Eyes: Negative.        Wears prescription lenses.  Respiratory: Positive for shortness of breath (with exertion).   Cardiovascular: Positive for leg swelling. Negative for chest pain and palpitations.  Gastrointestinal: Negative.   Endocrine: Negative.        Patient is diabetic.  Genitourinary: Negative.   Musculoskeletal: Positive for arthralgias, back pain, gait problem and myalgias. Negative for joint swelling.       Pain right knee.  Skin: Negative.   Allergic/Immunologic: Negative.   Neurological: Negative.   Hematological: Negative.   Psychiatric/Behavioral:       Chronic anxiety. Mild depression.    Filed Vitals:   01/29/14 1216  BP: 156/88  Pulse: 88  Temp: 98.1 F (36.7 C)  TempSrc: Oral  Height: 6\' 1"  (1.854 m)  Weight: 213 lb 6.4 oz (96.798 kg)  SpO2: 95%   Body mass index is 28.16 kg/(m^2).  Physical Exam  Constitutional: He is oriented to person, place, and time.  Moderately obese. No acute distress.  HENT:  Head: Normocephalic.  Nose: Nose normal.  Mouth/Throat: Oropharynx is clear and moist. No oropharyngeal exudate.  Mild hearing loss.  Eyes: Conjunctivae and EOM are  normal. Pupils are equal, round, and reactive to light.  Neck: Normal range of motion. Neck supple. No JVD present. No tracheal deviation present. No thyromegaly present.  Cardiovascular: Normal rate, regular rhythm, normal heart sounds and intact distal pulses.  Exam reveals no gallop and no friction rub.   No murmur heard. Pulmonary/Chest: Effort normal. No respiratory distress. He has no wheezes. He has rales. He exhibits no tenderness.  Abdominal: Soft. Bowel sounds are normal. He exhibits no distension and no mass. There is no tenderness.    Musculoskeletal: He exhibits edema. He exhibits no tenderness.  Small effusion right knee. Unable to fully extend right knee and lower leg.  Lymphadenopathy:    He has no cervical adenopathy.  Neurological: He is alert and oriented to person, place, and time. No cranial nerve deficit. Coordination normal.  Skin: Skin is warm and dry. No rash noted.  Abrasion of the left lateral malleolus  Psychiatric: He has a normal mood and affect. His behavior is normal. Judgment and thought content normal.     Labs reviewed: Orders Only on 11/04/2013  Component Date Value Ref Range Status  . Cholesterol, Total 11/04/2013 118  100 - 199 mg/dL Final  . Triglycerides 11/04/2013 44  0 - 149 mg/dL Final  . HDL 11/04/2013 46  >39 mg/dL Final   Comment: According to ATP-III Guidelines, HDL-C >59 mg/dL is considered a                          negative risk factor for CHD.  Marland Kitchen VLDL Cholesterol Cal 11/04/2013 9  5 - 40 mg/dL Final  . LDL Calculated 11/04/2013 63  0 - 99 mg/dL Final  . Hemoglobin A1C 11/04/2013 7.9* 4.8 - 5.6 % Final   Comment:          Increased risk for diabetes: 5.7 - 6.4                                   Diabetes: >6.4                                   Glycemic control for adults with diabetes: <7.0      Assessment/Plan  1. Other malaise and fatigue Etiology unucertain - CMP - TSH  2. 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 Unspecified essential hypertension controlled  4. Edema Brawny edema  5. Congestive heart failure, unspecified - Brain natriuretic peptide; Future  6. Dyspnea - Brain natriuretic peptide; Future

## 2014-01-30 LAB — COMPREHENSIVE METABOLIC PANEL
ALK PHOS: 200 IU/L — AB (ref 39–117)
ALT: 14 IU/L (ref 0–44)
AST: 20 IU/L (ref 0–40)
Albumin/Globulin Ratio: 1.3 (ref 1.1–2.5)
Albumin: 4 g/dL (ref 3.5–4.7)
BILIRUBIN TOTAL: 0.9 mg/dL (ref 0.0–1.2)
BUN/Creatinine Ratio: 20 (ref 10–22)
BUN: 23 mg/dL (ref 8–27)
CHLORIDE: 95 mmol/L — AB (ref 97–108)
CO2: 25 mmol/L (ref 18–29)
Calcium: 10 mg/dL (ref 8.6–10.2)
Creatinine, Ser: 1.17 mg/dL (ref 0.76–1.27)
GFR calc Af Amer: 64 mL/min/{1.73_m2} (ref >59)
GFR, EST NON AFRICAN AMERICAN: 56 mL/min/{1.73_m2} — AB (ref >59)
GLOBULIN, TOTAL: 3.2 g/dL (ref 1.5–4.5)
Glucose: 234 mg/dL — ABNORMAL HIGH (ref 65–99)
POTASSIUM: 4 mmol/L (ref 3.5–5.2)
SODIUM: 136 mmol/L (ref 134–144)
Total Protein: 7.2 g/dL (ref 6.0–8.5)

## 2014-01-30 LAB — HEMOGLOBIN A1C
ESTIMATED AVERAGE GLUCOSE: 186 mg/dL
Hgb A1c MFr Bld: 8.1 % — ABNORMAL HIGH (ref 4.8–5.6)

## 2014-01-30 LAB — BRAIN NATRIURETIC PEPTIDE: BNP: 524.5 pg/mL — ABNORMAL HIGH (ref 0.0–100.0)

## 2014-01-30 LAB — TSH: TSH: 2.56 u[IU]/mL (ref 0.450–4.500)

## 2014-01-31 ENCOUNTER — Telehealth: Payer: Self-pay | Admitting: *Deleted

## 2014-01-31 NOTE — Telephone Encounter (Signed)
Message copied by Eilene Ghazi on Fri Jan 31, 2014  3:18 PM ------      Message from: Logan Sharp      Created: Thu Jan 30, 2014 12:01 PM       Call. Blood sugar running a little high. Other results are acceptable. No abnormalities that would explain his weakness. ------

## 2014-01-31 NOTE — Telephone Encounter (Signed)
Spoke with patient regarding his blood sugar, he stated that is normal for him.

## 2014-02-17 ENCOUNTER — Encounter (INDEPENDENT_AMBULATORY_CARE_PROVIDER_SITE_OTHER): Payer: Medicare Other | Admitting: Ophthalmology

## 2014-02-17 DIAGNOSIS — H3530 Unspecified macular degeneration: Secondary | ICD-10-CM

## 2014-02-17 DIAGNOSIS — H4310 Vitreous hemorrhage, unspecified eye: Secondary | ICD-10-CM

## 2014-02-17 DIAGNOSIS — E1165 Type 2 diabetes mellitus with hyperglycemia: Secondary | ICD-10-CM

## 2014-02-17 DIAGNOSIS — E1139 Type 2 diabetes mellitus with other diabetic ophthalmic complication: Secondary | ICD-10-CM

## 2014-02-17 DIAGNOSIS — E11359 Type 2 diabetes mellitus with proliferative diabetic retinopathy without macular edema: Secondary | ICD-10-CM

## 2014-02-21 ENCOUNTER — Other Ambulatory Visit: Payer: Self-pay | Admitting: Internal Medicine

## 2014-03-10 ENCOUNTER — Other Ambulatory Visit: Payer: Medicare Other

## 2014-03-10 DIAGNOSIS — E1165 Type 2 diabetes mellitus with hyperglycemia: Principal | ICD-10-CM

## 2014-03-10 DIAGNOSIS — E785 Hyperlipidemia, unspecified: Secondary | ICD-10-CM

## 2014-03-10 DIAGNOSIS — E1139 Type 2 diabetes mellitus with other diabetic ophthalmic complication: Secondary | ICD-10-CM

## 2014-03-11 LAB — LIPID PANEL
CHOLESTEROL TOTAL: 115 mg/dL (ref 100–199)
Chol/HDL Ratio: 3.1 ratio units (ref 0.0–5.0)
HDL: 37 mg/dL — AB (ref >39)
LDL Calculated: 66 mg/dL (ref 0–99)
Triglycerides: 58 mg/dL (ref 0–149)
VLDL Cholesterol Cal: 12 mg/dL (ref 5–40)

## 2014-03-11 LAB — HEMOGLOBIN A1C
Est. average glucose Bld gHb Est-mCnc: 186 mg/dL
Hgb A1c MFr Bld: 8.1 % — ABNORMAL HIGH (ref 4.8–5.6)

## 2014-03-12 ENCOUNTER — Ambulatory Visit (INDEPENDENT_AMBULATORY_CARE_PROVIDER_SITE_OTHER): Payer: Medicare Other | Admitting: Internal Medicine

## 2014-03-12 ENCOUNTER — Encounter: Payer: Self-pay | Admitting: Internal Medicine

## 2014-03-12 VITALS — BP 130/74 | HR 79 | Temp 98.0°F | Resp 18 | Ht 73.0 in | Wt 202.4 lb

## 2014-03-12 DIAGNOSIS — Z79899 Other long term (current) drug therapy: Secondary | ICD-10-CM

## 2014-03-12 DIAGNOSIS — E162 Hypoglycemia, unspecified: Secondary | ICD-10-CM | POA: Insufficient documentation

## 2014-03-12 DIAGNOSIS — E11622 Type 2 diabetes mellitus with other skin ulcer: Secondary | ICD-10-CM

## 2014-03-12 DIAGNOSIS — Z23 Encounter for immunization: Secondary | ICD-10-CM

## 2014-03-12 DIAGNOSIS — E1169 Type 2 diabetes mellitus with other specified complication: Secondary | ICD-10-CM

## 2014-03-12 DIAGNOSIS — L97309 Non-pressure chronic ulcer of unspecified ankle with unspecified severity: Secondary | ICD-10-CM

## 2014-03-12 DIAGNOSIS — E1139 Type 2 diabetes mellitus with other diabetic ophthalmic complication: Secondary | ICD-10-CM

## 2014-03-12 DIAGNOSIS — E1165 Type 2 diabetes mellitus with hyperglycemia: Secondary | ICD-10-CM

## 2014-03-12 DIAGNOSIS — I10 Essential (primary) hypertension: Secondary | ICD-10-CM

## 2014-03-12 DIAGNOSIS — R609 Edema, unspecified: Secondary | ICD-10-CM

## 2014-03-12 MED ORDER — TORSEMIDE 10 MG PO TABS: ORAL_TABLET | ORAL | Status: DC

## 2014-03-12 NOTE — Progress Notes (Signed)
Patient ID: Logan Sharp, male   DOB: [birth year], 80 y.o.   MRN: [id number]    Location:    [facility]  Place of Service:  OFFICE    Allergies  Allergen Reactions  . Darvocet [Propoxyphene N-Acetaminophen]   . Daypro [Oxaprozin]   . Effexor [Venlafaxine Hcl]   . Glucosamine Chondroitin Adv [Cholestatin]   . Lorcet Plus [Hydrocodone-Acetaminophen]   . Propulsid [Cisapride]   . Tenormin [Atenolol]   . Wellbutrin [Bupropion]   . Zoloft [Sertraline Hcl]     Chief Complaint  Patient presents with  . Medical Management of Chronic Issues    Low BS incident late Sat evening. ? about insulin    HPI:  Episode of blackout Sat 03/06/11/15 in cafeteria where he lost control of bladder and head fell back. Was having a weak feeling and could not walk prior to the black out. No seizure noted. No chest pain or palpitations. He thinks he had hypoglycemia. He refused transport to hospital. Amelia Jo is the first time he has had episode like this. Felt much better after he drank sweet tea. Has not reoccurred.  Diabetic ankle ulcer: left lateral malleolus. Small and slightly tender. Not infected.  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 He is losing weight and recently had hypoglycemic episode, so I do not want to increase medication at this time.   Unspecified essential hypertension: controlled     Medications: Patient's Medications  New Prescriptions   No medications on file  Previous Medications   ASPIRIN EC 81 MG TABLET    One daily to prevent stroke or heart attack.   BUSPIRONE (BUSPAR) 15 MG TABLET    One up to twice daily to help anxiety AS NEEDED ONLY   CHOLECALCIFEROL (VITAMIN D) 1000 UNITS TABLET    Take 1,000 Units by mouth daily.   COMBIGAN 0.2-0.5 % OPHTHALMIC  SOLUTION    Place 1 drop into both eyes 2 (two) times daily.   DIGOXIN (LANOXIN) 0.25 MG TABLET    Take 1 tablet (250 mcg total) by mouth daily.   INSULIN ASPART PROTAMINE-INSULIN ASPART (NOVOLOG MIX 70/30) (70-30) 100 UNIT/ML INJECTION    Inject 20-25 units before breakfast and 15-20 units before supper to contro diabetes.   LOSARTAN-HYDROCHLOROTHIAZIDE (HYZAAR) 100-25 MG PER TABLET    TAKE ONE TABLET BY MOUTH ONCE DAILY FOR BLOOD PRESSURE   TORSEMIDE (DEMADEX) 10 MG TABLET    One daily to reduce edema  Modified Medications   No medications on file  Discontinued Medications   No medications on file     Review of Systems  Constitutional: Negative.   HENT: Positive for hearing loss and tinnitus (right).   Eyes: Negative.        Wears prescription lenses.  Respiratory: Positive for shortness of breath (with exertion).   Cardiovascular: Positive for leg swelling. Negative for chest pain and palpitations.  Gastrointestinal: Negative.   Endocrine: Negative.        Patient is diabetic.  Genitourinary: Negative.   Musculoskeletal: Positive for arthralgias, back pain, gait problem and myalgias. Negative for joint swelling.       Pain right knee.  Skin:       Shallow ulcer of the left lateral ankle.  Allergic/Immunologic: Negative.   Neurological: Negative.   Hematological: Negative.  Psychiatric/Behavioral:       Chronic anxiety. Mild depression.    Filed Vitals:   03/12/14 1513  BP: 130/74  Pulse: 79  Temp: 98 F (36.7 C)  TempSrc: Oral  Resp: 18  Height: 6\' 1"  (1.854 m)  Weight: 202 lb 6.4 oz (91.808 kg)  SpO2: 97%   Body mass index is 26.71 kg/(m^2).  Physical Exam  Constitutional: He is oriented to person, place, and time.  Moderately obese. No acute distress.  HENT:  Head: Normocephalic.  Nose: Nose normal.  Mouth/Throat: Oropharynx is clear and moist. No oropharyngeal exudate.  Mild hearing loss.  Eyes: Conjunctivae and EOM are normal. Pupils are equal, round,  and reactive to light.  Neck: Normal range of motion. Neck supple. No JVD present. No tracheal deviation present. No thyromegaly present.  Cardiovascular: Normal rate, regular rhythm, normal heart sounds and intact distal pulses.  Exam reveals no gallop and no friction rub.   No murmur heard. Pulmonary/Chest: Effort normal. No respiratory distress. He has no wheezes. He has rales. He exhibits no tenderness.  Abdominal: Soft. Bowel sounds are normal. He exhibits no distension and no mass. There is no tenderness.  Musculoskeletal: He exhibits edema. He exhibits no tenderness.  Small effusion right knee. Unable to fully extend right knee and lower leg.  Lymphadenopathy:    He has no cervical adenopathy.  Neurological: He is alert and oriented to person, place, and time. No cranial nerve deficit. Coordination normal.  Skin: Skin is warm and dry. No rash noted.  Shallow ulcer of the left lateral malleolus  Psychiatric: He has a normal mood and affect. His behavior is normal. Judgment and thought content normal.     Labs reviewed: Appointment on 03/10/2014  Component Date Value Ref Range Status  . Hemoglobin A1C 03/10/2014 8.1* 4.8 - 5.6 % Final   Comment:          Increased risk for diabetes: 5.7 - 6.4                                   Diabetes: >6.4                                   Glycemic control for adults with diabetes: <7.0  . Estimated average glucose 03/10/2014 186   Final  . Cholesterol, Total 03/10/2014 115  100 - 199 mg/dL Final  . Triglycerides 03/10/2014 58  0 - 149 mg/dL Final  . HDL 03/10/2014 37* >39 mg/dL Final   Comment: According to ATP-III Guidelines, HDL-C >59 mg/dL is considered a                          negative risk factor for CHD.  Marland Kitchen VLDL Cholesterol Cal 03/10/2014 12  5 - 40 mg/dL Final  . LDL Calculated 03/10/2014 66  0 - 99 mg/dL Final  . Chol/HDL Ratio 03/10/2014 3.1  0.0 - 5.0 ratio units Final   Comment:                                   T. Chol/HDL Ratio  Men  Women                                                        1/2 Avg.Risk  3.4    3.3                                                            Avg.Risk  5.0    4.4                                                         2X Avg.Risk  9.6    7.1                                                         3X Avg.Risk 23.4   11.0  Office Visit on 01/29/2014  Component Date Value Ref Range Status  . Hemoglobin A1C 01/29/2014 8.1* 4.8 - 5.6 % Final   Comment:          Increased risk for diabetes: 5.7 - 6.4                                   Diabetes: >6.4                                   Glycemic control for adults with diabetes: <7.0  . Estimated average glucose 01/29/2014 186   Final  . Glucose 01/29/2014 234* 65 - 99 mg/dL Final  . BUN 01/29/2014 23  8 - 27 mg/dL Final  . Creatinine, Ser 01/29/2014 1.17  0.76 - 1.27 mg/dL Final  . GFR calc non Af Amer 01/29/2014 56* >59 mL/min/1.73 Final  . GFR calc Af Amer 01/29/2014 64  >59 mL/min/1.73 Final  . BUN/Creatinine Ratio 01/29/2014 20  10 - 22 Final  . Sodium 01/29/2014 136  134 - 144 mmol/L Final  . Potassium 01/29/2014 4.0  3.5 - 5.2 mmol/L Final  . Chloride 01/29/2014 95* 97 - 108 mmol/L Final  . CO2 01/29/2014 25  18 - 29 mmol/L Final  . Calcium 01/29/2014 10.0  8.6 - 10.2 mg/dL Final  . Total Protein 01/29/2014 7.2  6.0 - 8.5 g/dL Final  . Albumin 01/29/2014 4.0  3.5 - 4.7 g/dL Final  . Globulin, Total 01/29/2014 3.2  1.5 - 4.5 g/dL Final  . Albumin/Globulin Ratio 01/29/2014 1.3  1.1 - 2.5 Final  . Total Bilirubin 01/29/2014 0.9  0.0 - 1.2 mg/dL Final  . Alkaline Phosphatase 01/29/2014 200* 39 - 117 IU/L Final  . AST 01/29/2014 20  0 - 40 IU/L Final  . ALT 01/29/2014 14  0 - 44 IU/L Final  .  TSH 01/29/2014 2.560  0.450 - 4.500 uIU/mL Final  . BNP 01/29/2014 524.5* 0.0 - 100.0 pg/mL Final   Assessment/Plan 1. Need for prophylactic vaccination and inoculation  against influenza Given today  2. Hypoglycemia - CMP  3. Diabetic ankle ulcer Use small hydrocolloid bandages.  4. Edema - torsemide (DEMADEX) 10 MG tablet; 1.5 tablets daily to control edema  Dispense: 135 tablet; Refill: 2  5. Encounter for long-term (current) use of other medications - Digoxin level  6. 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 Continue to monitor routinely Recent episode af unconciousness most likely associated with hypoglycemia  7. Unspecified essential hypertension controlled

## 2014-03-13 LAB — COMPREHENSIVE METABOLIC PANEL
ALBUMIN: 4.2 g/dL (ref 3.5–4.7)
ALK PHOS: 230 IU/L — AB (ref 39–117)
ALT: 15 IU/L (ref 0–44)
AST: 22 IU/L (ref 0–40)
Albumin/Globulin Ratio: 1.3 (ref 1.1–2.5)
BILIRUBIN TOTAL: 1.1 mg/dL (ref 0.0–1.2)
BUN / CREAT RATIO: 21 (ref 10–22)
BUN: 28 mg/dL — ABNORMAL HIGH (ref 8–27)
CO2: 28 mmol/L (ref 18–29)
CREATININE: 1.35 mg/dL — AB (ref 0.76–1.27)
Calcium: 9.8 mg/dL (ref 8.6–10.2)
Chloride: 94 mmol/L — ABNORMAL LOW (ref 97–108)
GFR calc Af Amer: 54 mL/min/{1.73_m2} — ABNORMAL LOW (ref >59)
GFR calc non Af Amer: 47 mL/min/{1.73_m2} — ABNORMAL LOW (ref >59)
GLOBULIN, TOTAL: 3.3 g/dL (ref 1.5–4.5)
Glucose: 158 mg/dL — ABNORMAL HIGH (ref 65–99)
Potassium: 3.7 mmol/L (ref 3.5–5.2)
SODIUM: 138 mmol/L (ref 134–144)
Total Protein: 7.5 g/dL (ref 6.0–8.5)

## 2014-03-13 LAB — DIGOXIN LEVEL: Digoxin Level: 1.3 ng/mL (ref 0.9–2.0)

## 2014-04-28 ENCOUNTER — Encounter (INDEPENDENT_AMBULATORY_CARE_PROVIDER_SITE_OTHER): Payer: Medicare Other | Admitting: Ophthalmology

## 2014-04-28 DIAGNOSIS — E11329 Type 2 diabetes mellitus with mild nonproliferative diabetic retinopathy without macular edema: Secondary | ICD-10-CM

## 2014-04-28 DIAGNOSIS — H3531 Nonexudative age-related macular degeneration: Secondary | ICD-10-CM

## 2014-04-28 DIAGNOSIS — E11319 Type 2 diabetes mellitus with unspecified diabetic retinopathy without macular edema: Secondary | ICD-10-CM

## 2014-04-28 DIAGNOSIS — E11359 Type 2 diabetes mellitus with proliferative diabetic retinopathy without macular edema: Secondary | ICD-10-CM

## 2014-04-28 DIAGNOSIS — H4312 Vitreous hemorrhage, left eye: Secondary | ICD-10-CM

## 2014-04-28 DIAGNOSIS — I10 Essential (primary) hypertension: Secondary | ICD-10-CM

## 2014-04-28 DIAGNOSIS — H35033 Hypertensive retinopathy, bilateral: Secondary | ICD-10-CM

## 2014-04-28 DIAGNOSIS — H43813 Vitreous degeneration, bilateral: Secondary | ICD-10-CM

## 2014-05-11 ENCOUNTER — Other Ambulatory Visit: Payer: Self-pay | Admitting: Internal Medicine

## 2014-06-06 ENCOUNTER — Other Ambulatory Visit: Payer: Self-pay | Admitting: Medical Oncology

## 2014-07-21 ENCOUNTER — Other Ambulatory Visit: Payer: Medicare Other

## 2014-07-23 ENCOUNTER — Ambulatory Visit: Payer: Medicare Other | Admitting: Internal Medicine

## 2014-07-29 ENCOUNTER — Ambulatory Visit (INDEPENDENT_AMBULATORY_CARE_PROVIDER_SITE_OTHER): Payer: BLUE CROSS/BLUE SHIELD | Admitting: Internal Medicine

## 2014-07-29 ENCOUNTER — Encounter: Payer: Self-pay | Admitting: Internal Medicine

## 2014-07-29 VITALS — BP 140/82 | HR 80 | Temp 97.3°F | Resp 10 | Ht 73.0 in | Wt 198.0 lb

## 2014-07-29 DIAGNOSIS — E11622 Type 2 diabetes mellitus with other skin ulcer: Secondary | ICD-10-CM

## 2014-07-29 DIAGNOSIS — E11329 Type 2 diabetes mellitus with mild nonproliferative diabetic retinopathy without macular edema: Secondary | ICD-10-CM

## 2014-07-29 DIAGNOSIS — M25561 Pain in right knee: Secondary | ICD-10-CM

## 2014-07-29 DIAGNOSIS — I10 Essential (primary) hypertension: Secondary | ICD-10-CM

## 2014-07-29 DIAGNOSIS — E113299 Type 2 diabetes mellitus with mild nonproliferative diabetic retinopathy without macular edema, unspecified eye: Secondary | ICD-10-CM

## 2014-07-29 DIAGNOSIS — R0600 Dyspnea, unspecified: Secondary | ICD-10-CM

## 2014-07-29 DIAGNOSIS — R609 Edema, unspecified: Secondary | ICD-10-CM

## 2014-07-29 DIAGNOSIS — R748 Abnormal levels of other serum enzymes: Secondary | ICD-10-CM | POA: Insufficient documentation

## 2014-07-29 DIAGNOSIS — K130 Diseases of lips: Secondary | ICD-10-CM

## 2014-07-29 DIAGNOSIS — L97309 Non-pressure chronic ulcer of unspecified ankle with unspecified severity: Secondary | ICD-10-CM

## 2014-07-29 MED ORDER — LOSARTAN POTASSIUM-HCTZ 100-25 MG PO TABS: ORAL_TABLET | ORAL | Status: DC

## 2014-07-29 MED ORDER — BUMETANIDE 2 MG PO TABS: ORAL_TABLET | ORAL | Status: DC

## 2014-07-29 MED ORDER — LOSARTAN POTASSIUM-HCTZ 100-25 MG PO TABS: ORAL_TABLET | ORAL | Status: AC

## 2014-07-29 NOTE — Progress Notes (Signed)
Patient ID: Logan Sharp, male   DOB: Jun 22, 1924, 81 y.o.   MRN: [id number]    Facility  [facility]    Place of Service:   OFFICE  Emergency contact: Lynnea Ferrier [place]: [place] ;[phone]   Allergies  Allergen Reactions  . Darvocet [Propoxyphene N-Acetaminophen]   . Daypro [Oxaprozin]   . Effexor [Venlafaxine Hcl]   . Glucosamine Chondroitin Adv [Cholestatin]   . Lorcet Plus [Hydrocodone-Acetaminophen]   . Propulsid [Cisapride]   . Tenormin [Atenolol]   . Wellbutrin [Bupropion]   . Zoloft [Sertraline Hcl]     Chief Complaint  Patient presents with  . Medical Management of Chronic Issues    3 month follow-up, no recent labs     HPI:  Alkaline phosphatase elevation - value was 230 the last time lab was done. Each repeat.  Type 2 diabetes mellitus with mild nonproliferative diabetic retinopathy without macular edema - using less insulin. Morning glucose around 100 more last. Has been losing weight and following his diet. No longer feels hungry. Has lost about 13 pounds in the last 5 months.  Diabetic ankle ulcer: Left lateral malleolus is healed but the tissues are quite thin.  Edema - insurance company will no longer pay for torsemide. We will substitute bumetanide.  Essential hypertension -controlled  Lesion of lip 2 x 3 indurated area of the lower lip on the left side. Has been nonhealing for at least 4 months.  Pain in right knee: Stable at this time.  Dyspnea: Improved  Medications: Patient's Medications  New Prescriptions   No medications on file  Previous Medications   ASPIRIN EC 81 MG TABLET    One daily to prevent stroke or heart attack.   BUSPIRONE (BUSPAR) 15 MG TABLET    One up to twice daily to help anxiety AS NEEDED ONLY   CHOLECALCIFEROL (VITAMIN D) 1000 UNITS TABLET    Take 1,000 Units by mouth daily.   COMBIGAN 0.2-0.5 % OPHTHALMIC SOLUTION    Place 1 drop into both eyes 2 (two) times daily.   DIGOXIN (LANOXIN) 0.25 MG TABLET     Take 1 tablet (250 mcg total) by mouth daily.   INSULIN ASPART PROTAMINE-INSULIN ASPART (NOVOLOG MIX 70/30) (70-30) 100 UNIT/ML INJECTION    Inject 20-25 units before breakfast and 15-20 units before supper to contro diabetes.   LOSARTAN-HYDROCHLOROTHIAZIDE (HYZAAR) 100-25 MG PER TABLET    TAKE ONE TABLET BY MOUTH ONCE DAILY FOR BLOOD PRESSURE   TORSEMIDE (DEMADEX) 10 MG TABLET    1.5 tablets daily to control edema  Modified Medications   No medications on file  Discontinued Medications   No medications on file     Review of Systems  Constitutional: Negative.   HENT: Positive for hearing loss and tinnitus (right).   Eyes: Negative.        Wears prescription lenses.  Respiratory: Positive for shortness of breath (with exertion).   Cardiovascular: Positive for leg swelling. Negative for chest pain and palpitations.  Gastrointestinal: Negative.   Endocrine: Negative.        Patient is diabetic.  Genitourinary: Negative.   Musculoskeletal: Positive for myalgias, back pain, arthralgias and gait problem. Negative for joint swelling.       Pain right knee.  Skin:       healed ulcer of the left lateral ankle.  Allergic/Immunologic: Negative.   Neurological: Negative.   Hematological: Negative.   Psychiatric/Behavioral:       Chronic anxiety. Mild depression.    Filed  Vitals:   07/29/14 1408  BP: 140/82  Pulse: 80  Temp: 97.3 F (36.3 C)  TempSrc: Oral  Resp: 10  Height: 6\' 1"  (1.854 m)  Weight: 198 lb (89.812 kg)  SpO2: 97%   Body mass index is 26.13 kg/(m^2).  Physical Exam  Constitutional: He is oriented to person, place, and time.  Moderately obese. No acute distress.  HENT:  Head: Normocephalic.  Nose: Nose normal.  Mouth/Throat: Oropharynx is clear and moist. No oropharyngeal exudate.  Mild hearing loss.  Eyes: Conjunctivae and EOM are normal. Pupils are equal, round, and reactive to light.  Neck: Normal range of motion. Neck supple. No JVD present. No tracheal  deviation present. No thyromegaly present.  Cardiovascular: Normal rate, regular rhythm, normal heart sounds and intact distal pulses.  Exam reveals no gallop and no friction rub.   No murmur heard. Pulmonary/Chest: Effort normal. No respiratory distress. He has no wheezes. He has rales. He exhibits no tenderness.  Abdominal: Soft. Bowel sounds are normal. He exhibits no distension and no mass. There is no tenderness.  Musculoskeletal: He exhibits edema. He exhibits no tenderness.  Small effusion right knee. Unable to fully extend right knee and lower leg.  Lymphadenopathy:    He has no cervical adenopathy.  Neurological: He is alert and oriented to person, place, and time. No cranial nerve deficit. Coordination normal.  Skin: Skin is warm and dry. No rash noted.  Shallow ulcer of the left lateral malleolus has healed, but the tissue is still fragile.  Psychiatric: He has a normal mood and affect. His behavior is normal. Judgment and thought content normal.     Labs reviewed: No visits with results within 3 Month(s) from this visit. Latest known visit with results is:  Office Visit on 03/12/2014  Component Date Value Ref Range Status  . Digoxin Level 03/12/2014 1.3  0.9 - 2.0 ng/mL Final   Comment: Concentrations above 2.0 ng/mL are generally considered                          toxic. Some overlap of toxic and non-toxic values have                          been reported.                                                      Detection Limit = 0.2 ng/mL  . Glucose 03/12/2014 158* 65 - 99 mg/dL Final  . BUN 03/12/2014 28* 8 - 27 mg/dL Final  . Creatinine, Ser 03/12/2014 1.35* 0.76 - 1.27 mg/dL Final  . GFR calc non Af Amer 03/12/2014 47* >59 mL/min/1.73 Final  . GFR calc Af Amer 03/12/2014 54* >59 mL/min/1.73 Final  . BUN/Creatinine Ratio 03/12/2014 21  10 - 22 Final  . Sodium 03/12/2014 138  134 - 144 mmol/L Final  . Potassium 03/12/2014 3.7  3.5 - 5.2 mmol/L Final  . Chloride  03/12/2014 94* 97 - 108 mmol/L Final  . CO2 03/12/2014 28  18 - 29 mmol/L Final  . Calcium 03/12/2014 9.8  8.6 - 10.2 mg/dL Final  . Total Protein 03/12/2014 7.5  6.0 - 8.5 g/dL Final  . Albumin 03/12/2014 4.2  3.5 - 4.7 g/dL Final  . Globulin,  Total 03/12/2014 3.3  1.5 - 4.5 g/dL Final  . Albumin/Globulin Ratio 03/12/2014 1.3  1.1 - 2.5 Final  . Total Bilirubin 03/12/2014 1.1  0.0 - 1.2 mg/dL Final  . Alkaline Phosphatase 03/12/2014 230* 39 - 117 IU/L Final  . AST 03/12/2014 22  0 - 40 IU/L Final  . ALT 03/12/2014 15  0 - 44 IU/L Final     Assessment/Plan  1. Alkaline phosphatase elevation - Comprehensive metabolic panel; Future  2. Type 2 diabetes mellitus with mild nonproliferative diabetic retinopathy without macular edema - Hemoglobin A1c; Future  3. Diabetic ankle ulcer Advised to continue to use a hydrocolloid bandages on the fragile area of the left lateral malleolus  4. Edema Discontinue torsemide; not on formulary for his drug plan - bumetanide (BUMEX) 2 MG tablet; One daily to control edema  Dispense: 90 tablet; Refill: 4  5. Essential hypertension Controlled - Comprehensive metabolic panel; Future - losartan-hydrochlorothiazide (HYZAAR) 100-25 MG per tablet; One daily to control blood pressure  Dispense: 90 tablet; Refill: 4  6. Lesion on lip Dermatology referral  7. Pain in right knee Improved  8. Dyspnea Improved

## 2014-08-06 ENCOUNTER — Other Ambulatory Visit: Payer: BLUE CROSS/BLUE SHIELD

## 2014-08-31 ENCOUNTER — Other Ambulatory Visit: Payer: Self-pay | Admitting: Internal Medicine

## 2014-09-23 ENCOUNTER — Telehealth: Payer: Self-pay | Admitting: *Deleted

## 2014-09-23 MED ORDER — FUROSEMIDE 80 MG PO TABS: ORAL_TABLET | ORAL | Status: DC

## 2014-09-23 NOTE — Telephone Encounter (Signed)
Patient notified and faxed Rx to pharmacy.

## 2014-09-23 NOTE — Telephone Encounter (Signed)
Also confirmed appointment and scheduled lab appointment with patient.

## 2014-09-23 NOTE — Telephone Encounter (Signed)
Discontinue bumetanide. Start furosemide 80 mg 1 daily to control edema. Dispense 90 tablets with 3 refills.

## 2014-09-23 NOTE — Telephone Encounter (Signed)
Patient called and stated that insurance sent him a letter stating that they will no longer cover his Bumex for swelling. Patient upset because they said the same thing about his Torsemide. Patient would like to know if you will change it to something else. Please Advise.

## 2014-10-03 ENCOUNTER — Telehealth: Payer: Self-pay | Admitting: *Deleted

## 2014-10-03 ENCOUNTER — Other Ambulatory Visit: Payer: Medicare Other

## 2014-10-03 DIAGNOSIS — I10 Essential (primary) hypertension: Secondary | ICD-10-CM

## 2014-10-03 NOTE — Telephone Encounter (Signed)
Patient Notified and agreed. Placed orders

## 2014-10-03 NOTE — Telephone Encounter (Signed)
Have him come to the lab for BMP. He can take a half tablet of furosemide equal to 40 mg.

## 2014-10-03 NOTE — Telephone Encounter (Signed)
Patient stated that he felt like he was going to pass out yesterday. Had a breathing problem and felt like he was going to pass out. Feels like it is coming from the Bumex (he had some and didn't want to waste them, has not started the Furosemide yet because he wanted to finish the other up)  and feels like it is too much. Feels like he is peeing all the time and it is draining him. States he can't take this high powered stuff. Felt like he was going to die yesterday. Wants to know if you can decease the Furosemide, scared to take it because it is 80mg . Patient is worried about his potassium and passing out.  Please Advise.

## 2014-10-04 LAB — BASIC METABOLIC PANEL
BUN / CREAT RATIO: 31 — AB (ref 10–22)
BUN: 42 mg/dL — ABNORMAL HIGH (ref 8–27)
CALCIUM: 9.8 mg/dL (ref 8.6–10.2)
CO2: 27 mmol/L (ref 18–29)
CREATININE: 1.37 mg/dL — AB (ref 0.76–1.27)
Chloride: 96 mmol/L — ABNORMAL LOW (ref 97–108)
GFR calc non Af Amer: 46 mL/min/{1.73_m2} — ABNORMAL LOW (ref >59)
GFR, EST AFRICAN AMERICAN: 53 mL/min/{1.73_m2} — AB (ref >59)
GLUCOSE: 224 mg/dL — AB (ref 65–99)
Potassium: 4.2 mmol/L (ref 3.5–5.2)
Sodium: 140 mmol/L (ref 134–144)

## 2014-10-14 ENCOUNTER — Encounter: Payer: Self-pay | Admitting: Internal Medicine

## 2014-10-14 ENCOUNTER — Ambulatory Visit (INDEPENDENT_AMBULATORY_CARE_PROVIDER_SITE_OTHER): Payer: Medicare Other | Admitting: Internal Medicine

## 2014-10-14 VITALS — BP 160/70 | HR 77 | Temp 97.4°F | Resp 18 | Ht 73.0 in | Wt 189.4 lb

## 2014-10-14 DIAGNOSIS — I5042 Chronic combined systolic (congestive) and diastolic (congestive) heart failure: Secondary | ICD-10-CM | POA: Diagnosis not present

## 2014-10-14 DIAGNOSIS — E11329 Type 2 diabetes mellitus with mild nonproliferative diabetic retinopathy without macular edema: Secondary | ICD-10-CM

## 2014-10-14 DIAGNOSIS — R609 Edema, unspecified: Secondary | ICD-10-CM | POA: Diagnosis not present

## 2014-10-14 DIAGNOSIS — R5381 Other malaise: Secondary | ICD-10-CM | POA: Diagnosis not present

## 2014-10-14 DIAGNOSIS — I10 Essential (primary) hypertension: Secondary | ICD-10-CM | POA: Diagnosis not present

## 2014-10-14 DIAGNOSIS — R748 Abnormal levels of other serum enzymes: Secondary | ICD-10-CM

## 2014-10-14 DIAGNOSIS — R634 Abnormal weight loss: Secondary | ICD-10-CM | POA: Diagnosis not present

## 2014-10-14 DIAGNOSIS — E11622 Type 2 diabetes mellitus with other skin ulcer: Secondary | ICD-10-CM | POA: Diagnosis not present

## 2014-10-14 DIAGNOSIS — E113299 Type 2 diabetes mellitus with mild nonproliferative diabetic retinopathy without macular edema, unspecified eye: Secondary | ICD-10-CM

## 2014-10-14 DIAGNOSIS — L97309 Non-pressure chronic ulcer of unspecified ankle with unspecified severity: Secondary | ICD-10-CM

## 2014-10-14 DIAGNOSIS — R0600 Dyspnea, unspecified: Secondary | ICD-10-CM

## 2014-10-14 NOTE — Progress Notes (Signed)
Patient ID: Logan Sharp, male   DOB: Oct 07, 1923, 81 y.o.   MRN: [id number]    Facility  [facility]    Place of Service:   OFFICE    Allergies  Allergen Reactions  . Darvocet [Propoxyphene N-Acetaminophen]   . Daypro [Oxaprozin]   . Effexor [Venlafaxine Hcl]   . Glucosamine Chondroitin Adv [Cholestatin]   . Lorcet Plus [Hydrocodone-Acetaminophen]   . Propulsid [Cisapride]   . Tenormin [Atenolol]   . Wellbutrin [Bupropion]   . Zoloft [Sertraline Hcl]     Chief Complaint  Patient presents with  . Medical Management of Chronic Issues    HPI:  Feelings of a "crazy Head". Pressure around his face. No syncope. No headache. Nose drains all the time.   Losing weight. Does not get hungry.  Loss of weight: Weight of 9515 was 213 pounds. Weight on 07/29/2014 was 198 pounds. Current weight 189 pounds.  Malaise: Vague complaints of just not feeling well.  Type 2 diabetes mellitus with mild nonproliferative diabetic retinopathy without macular edema - morning glucose at home today was 109 mg percent. His goal is to keep his morning glucose less than 150 mg percent. He feels that he is achieving this goal on most days.  Edema: improved  Essential hypertension: still some elevations in SBP  Dyspnea: Occasionally on exertion. Denies dyspnea at night.  Alkaline phosphatase elevation: Needs further follow-up  Diabetic ankle ulcer: Left lateral malleolus. Area is scaly and dry.  Chronic combined systolic and diastolic congestive heart failure - continues on digoxin. Denies serious shortness of breath although he does get short of breath on exertion. Edema has improved.  Medications: Patient's Medications  New Prescriptions   No medications on file  Previous Medications   ASPIRIN EC 81 MG TABLET    One daily to prevent stroke or heart attack.   BUSPIRONE (BUSPAR) 15 MG TABLET    One up to twice daily to help anxiety AS NEEDED ONLY   CHOLECALCIFEROL (VITAMIN D) 1000 UNITS TABLET     Take 1,000 Units by mouth daily.   COMBIGAN 0.2-0.5 % OPHTHALMIC SOLUTION    Place 1 drop into both eyes 2 (two) times daily.   DIGOXIN (LANOXIN) 0.25 MG TABLET    Take 1 tablet (250 mcg total) by mouth daily.   FUROSEMIDE (LASIX) 80 MG TABLET    Take one tablet by mouth once daily for edema   INSULIN ASPART PROTAMINE-INSULIN ASPART (NOVOLOG MIX 70/30) (70-30) 100 UNIT/ML INJECTION    Inject 20-25 units before breakfast and 15-20 units before supper to contro diabetes.   LOSARTAN-HYDROCHLOROTHIAZIDE (HYZAAR) 100-25 MG PER TABLET    One daily to control blood pressure  Modified Medications   No medications on file  Discontinued Medications   No medications on file     Review of Systems  Constitutional: Positive for activity change, appetite change, fatigue and unexpected weight change. Negative for fever.  HENT: Positive for hearing loss and tinnitus (right).   Eyes: Negative.        Wears prescription lenses.  Respiratory: Positive for shortness of breath (with exertion).   Cardiovascular: Positive for leg swelling. Negative for chest pain and palpitations.  Gastrointestinal: Negative.   Endocrine: Negative.        Patient is diabetic.  Genitourinary: Negative.   Musculoskeletal: Positive for myalgias, back pain, arthralgias and gait problem. Negative for joint swelling.       Pain right knee.  Skin:       healed ulcer of  the left lateral ankle.  Allergic/Immunologic: Negative.   Neurological: Negative.   Hematological: Negative.   Psychiatric/Behavioral:       Chronic anxiety. Mild depression.    Filed Vitals:   10/14/14 1529  BP: 160/70  Pulse: 77  Temp: 97.4 F (36.3 C)  TempSrc: Oral  Resp: 18  Height: 6\' 1"  (1.854 m)  Weight: 189 lb 6.4 oz (85.911 kg)  SpO2: 95%   Body mass index is 24.99 kg/(m^2).  Physical Exam  Constitutional: He is oriented to person, place, and time.  Moderately obese. No acute distress.  HENT:  Head: Normocephalic.  Nose: Nose normal.    Mouth/Throat: Oropharynx is clear and moist. No oropharyngeal exudate.  Mild hearing loss.  Eyes: Conjunctivae and EOM are normal. Pupils are equal, round, and reactive to light.  Neck: Normal range of motion. Neck supple. No JVD present. No tracheal deviation present. No thyromegaly present.  Cardiovascular: Normal rate, regular rhythm, normal heart sounds and intact distal pulses.  Exam reveals no gallop and no friction rub.   No murmur heard. Pulmonary/Chest: Effort normal. No respiratory distress. He has no wheezes. He has rales. He exhibits no tenderness.  Abdominal: Soft. Bowel sounds are normal. He exhibits no distension and no mass. There is no tenderness.  Musculoskeletal: He exhibits edema (TRACE). He exhibits no tenderness.  Small effusion right knee. Unable to fully extend right knee and lower leg.  Lymphadenopathy:    He has no cervical adenopathy.  Neurological: He is alert and oriented to person, place, and time. No cranial nerve deficit. Coordination normal.  Skin: Skin is warm and dry. No rash noted.  Shallow ulcer of the left lateral malleolus has healed, but the tissue is still fragile.  Psychiatric: His behavior is normal. Judgment and thought content normal.  Dysphoric mood     Labs reviewed: Appointment on 10/03/2014  Component Date Value Ref Range Status  . Glucose 10/03/2014 224* 65 - 99 mg/dL Final  . BUN 10/03/2014 42* 8 - 27 mg/dL Final  . Creatinine, Ser 10/03/2014 1.37* 0.76 - 1.27 mg/dL Final  . GFR calc non Af Amer 10/03/2014 46* >59 mL/min/1.73 Final  . GFR calc Af Amer 10/03/2014 53* >59 mL/min/1.73 Final  . BUN/Creatinine Ratio 10/03/2014 31* 10 - 22 Final  . Sodium 10/03/2014 140  134 - 144 mmol/L Final  . Potassium 10/03/2014 4.2  3.5 - 5.2 mmol/L Final  . Chloride 10/03/2014 96* 97 - 108 mmol/L Final  . CO2 10/03/2014 27  18 - 29 mmol/L Final  . Calcium 10/03/2014 9.8  8.6 - 10.2 mg/dL Final     Assessment/Plan  1. Loss of weight Etiology  unclear. I think mainly due to loss of appetite. Reasons for this are not clear.  2. Malaise Patient may be on more medication than he needs. He has lost a considerable amount of weight which I believe has reduced his need for diuretics  3. Type 2 diabetes mellitus with mild nonproliferative diabetic retinopathy without macular edema Controlled - Hemoglobin A1c; Future - Comprehensive metabolic panel; Future  4. Edema Improved. Discontinue furosemide.  5. Essential hypertension Mild elevation SBP. Prefer not to add more medication at this time due to his malaise and generalized weakness.  6. Dyspnea stable  7. Alkaline phosphatase elevation -CMP, future  8. Diabetic ankle ulcer Continue to use hydrocolloid bandages to protect this area.  9. Chronic combined systolic and diastolic congestive heart failure - Digoxin level; Future - EKG 12-Lead; Future

## 2014-10-24 ENCOUNTER — Other Ambulatory Visit: Payer: Medicare Other

## 2014-10-24 DIAGNOSIS — I5042 Chronic combined systolic (congestive) and diastolic (congestive) heart failure: Secondary | ICD-10-CM

## 2014-10-24 DIAGNOSIS — E113299 Type 2 diabetes mellitus with mild nonproliferative diabetic retinopathy without macular edema, unspecified eye: Secondary | ICD-10-CM

## 2014-10-25 LAB — COMPREHENSIVE METABOLIC PANEL
A/G RATIO: 1.1 (ref 1.1–2.5)
ALBUMIN: 3.8 g/dL (ref 3.5–4.7)
ALT: 17 IU/L (ref 0–44)
AST: 23 IU/L (ref 0–40)
Alkaline Phosphatase: 266 IU/L — ABNORMAL HIGH (ref 39–117)
BUN/Creatinine Ratio: 25 — ABNORMAL HIGH (ref 10–22)
BUN: 30 mg/dL — ABNORMAL HIGH (ref 8–27)
Bilirubin Total: 0.9 mg/dL (ref 0.0–1.2)
CO2: 21 mmol/L (ref 18–29)
Calcium: 9.2 mg/dL (ref 8.6–10.2)
Chloride: 99 mmol/L (ref 97–108)
Creatinine, Ser: 1.19 mg/dL (ref 0.76–1.27)
GFR calc Af Amer: 63 mL/min/{1.73_m2} (ref >59)
GFR, EST NON AFRICAN AMERICAN: 54 mL/min/{1.73_m2} — AB (ref >59)
GLOBULIN, TOTAL: 3.6 g/dL (ref 1.5–4.5)
Glucose: 110 mg/dL — ABNORMAL HIGH (ref 65–99)
POTASSIUM: 4.5 mmol/L (ref 3.5–5.2)
Sodium: 136 mmol/L (ref 134–144)
Total Protein: 7.4 g/dL (ref 6.0–8.5)

## 2014-10-25 LAB — HEMOGLOBIN A1C
Est. average glucose Bld gHb Est-mCnc: 186 mg/dL
HEMOGLOBIN A1C: 8.1 % — AB (ref 4.8–5.6)

## 2014-10-25 LAB — DIGOXIN LEVEL: Digoxin Level: 0.7 ng/mL — ABNORMAL LOW (ref 0.9–2.0)

## 2014-10-27 ENCOUNTER — Ambulatory Visit (INDEPENDENT_AMBULATORY_CARE_PROVIDER_SITE_OTHER): Payer: Medicare Other | Admitting: Ophthalmology

## 2014-10-29 ENCOUNTER — Encounter: Payer: Self-pay | Admitting: Internal Medicine

## 2014-10-29 ENCOUNTER — Ambulatory Visit (INDEPENDENT_AMBULATORY_CARE_PROVIDER_SITE_OTHER): Payer: Medicare Other | Admitting: Internal Medicine

## 2014-10-29 VITALS — BP 138/78 | HR 51 | Temp 97.9°F | Ht 73.0 in | Wt 204.0 lb

## 2014-10-29 DIAGNOSIS — R748 Abnormal levels of other serum enzymes: Secondary | ICD-10-CM | POA: Diagnosis not present

## 2014-10-29 DIAGNOSIS — I5042 Chronic combined systolic (congestive) and diastolic (congestive) heart failure: Secondary | ICD-10-CM | POA: Diagnosis not present

## 2014-10-29 DIAGNOSIS — I10 Essential (primary) hypertension: Secondary | ICD-10-CM

## 2014-10-29 DIAGNOSIS — E113299 Type 2 diabetes mellitus with mild nonproliferative diabetic retinopathy without macular edema, unspecified eye: Secondary | ICD-10-CM

## 2014-10-29 DIAGNOSIS — R0600 Dyspnea, unspecified: Secondary | ICD-10-CM

## 2014-10-29 DIAGNOSIS — E11329 Type 2 diabetes mellitus with mild nonproliferative diabetic retinopathy without macular edema: Secondary | ICD-10-CM

## 2014-10-29 DIAGNOSIS — F458 Other somatoform disorders: Secondary | ICD-10-CM

## 2014-10-29 NOTE — Patient Instructions (Signed)
Increase doxepin to twice daily.

## 2014-10-29 NOTE — Progress Notes (Signed)
Patient ID: Logan Sharp, male   DOB: 02-19-1924, 81 y.o.   MRN: [id number]    Facility  [facility]    Place of Service:   OFFICE    Allergies  Allergen Reactions  . Darvocet [Propoxyphene N-Acetaminophen]   . Daypro [Oxaprozin]   . Effexor [Venlafaxine Hcl]   . Glucosamine Chondroitin Adv [Cholestatin]   . Lorcet Plus [Hydrocodone-Acetaminophen]   . Propulsid [Cisapride]   . Tenormin [Atenolol]   . Wellbutrin [Bupropion]   . Zoloft [Sertraline Hcl]     Chief Complaint  Patient presents with  . Medical Management of Chronic Issues    3 Month Follow up    HPI:  Dyspnea: Chronically short of breath now. Sometimes he sits in a chair bruits even harder to try to "catch his breath". He never feels any better after this.  Alkaline phosphatase elevation: Gradual increases in the alkaline phosphatase over the last 4 chemistry checks. Parenchymal enzymes remain normal. He denies tenderness in the right upper quadrant or nausea.  Hyperventilation syndrome: Highly suspected. He has multiple symptoms which could well be related to chronic hyperventilation syndrome.  Chronic combined systolic and diastolic congestive heart failure: Mild increase in ankle edema. Chronic shortness of breath with dyspnea.  Type 2 diabetes mellitus with mild nonproliferative diabetic retinopathy without macular edema: Lab work from 10/24/2014 showed glucose 110 mg percent and A1c 8.1%. Patient cut out his evening insulin recently and says his morning sugars continue to run in the 120s.  Essential hypertension: Controlled    Medications: Patient's Medications  New Prescriptions   No medications on file  Previous Medications   ASPIRIN EC 81 MG TABLET    One daily to prevent stroke or heart attack.   BUSPIRONE (BUSPAR) 15 MG TABLET    One up to twice daily to help anxiety AS NEEDED ONLY   CHOLECALCIFEROL (VITAMIN D) 1000 UNITS TABLET    Take 1,000 Units by mouth daily.   COMBIGAN 0.2-0.5 % OPHTHALMIC  SOLUTION    Place 1 drop into both eyes 2 (two) times daily.   DIGOXIN (LANOXIN) 0.25 MG TABLET    Take 1 tablet (250 mcg total) by mouth daily.   DOXEPIN (SINEQUAN) 50 MG CAPSULE    Take 50 mg by mouth. One twice daily to help relax   FUROSEMIDE (LASIX) 80 MG TABLET    Take one tablet by mouth once daily for edema   INSULIN ASPART PROTAMINE-INSULIN ASPART (NOVOLOG MIX 70/30) (70-30) 100 UNIT/ML INJECTION    Inject 20-25 units before breakfast and 15-20 units before supper to contro diabetes.   LOSARTAN-HYDROCHLOROTHIAZIDE (HYZAAR) 100-25 MG PER TABLET    One daily to control blood pressure  Modified Medications   No medications on file  Discontinued Medications   No medications on file     Review of Systems  Constitutional: Positive for activity change, appetite change, fatigue and unexpected weight change. Negative for fever.  HENT: Positive for hearing loss and tinnitus (right).   Eyes: Negative.        Wears prescription lenses.  Respiratory: Positive for shortness of breath (with exertion).   Cardiovascular: Positive for leg swelling. Negative for chest pain and palpitations.  Gastrointestinal: Negative.   Endocrine: Negative.        Patient is diabetic.  Genitourinary: Negative.   Musculoskeletal: Positive for myalgias, back pain, arthralgias and gait problem. Negative for joint swelling.       Pain right knee.  Skin:       healed  ulcer of the left lateral ankle.  Allergic/Immunologic: Negative.   Neurological: Negative.   Hematological: Negative.   Psychiatric/Behavioral:       Chronic anxiety. Mild depression.    Filed Vitals:   10/29/14 1208  BP: 138/78  Pulse: 51  Temp: 97.9 F (36.6 C)  TempSrc: Oral  Height: 6\' 1"  (1.854 m)  Weight: 189 lb (85.73 kg)   Body mass index is 24.94 kg/(m^2).  Physical Exam  Constitutional: He is oriented to person, place, and time.  Moderately obese. No acute distress.  HENT:  Head: Normocephalic.  Nose: Nose normal.    Mouth/Throat: Oropharynx is clear and moist. No oropharyngeal exudate.  Mild hearing loss.  Eyes: Conjunctivae and EOM are normal. Pupils are equal, round, and reactive to light.  Neck: Normal range of motion. Neck supple. No JVD present. No tracheal deviation present. No thyromegaly present.  Cardiovascular: Normal rate, regular rhythm, normal heart sounds and intact distal pulses.  Exam reveals no gallop and no friction rub.   No murmur heard. Pulmonary/Chest: Effort normal. No respiratory distress. He has no wheezes. He has rales. He exhibits no tenderness.  Abdominal: Soft. Bowel sounds are normal. He exhibits no distension and no mass. There is no tenderness.  Musculoskeletal: He exhibits edema (TRACE). He exhibits no tenderness.  Small effusion right knee. Unable to fully extend right knee and lower leg.  Lymphadenopathy:    He has no cervical adenopathy.  Neurological: He is alert and oriented to person, place, and time. No cranial nerve deficit. Coordination normal.  Skin: Skin is warm and dry. No rash noted.  Shallow ulcer of the left lateral malleolus has healed, but the tissue is still fragile.  Psychiatric: His behavior is normal. Judgment and thought content normal.  Dysphoric mood     Labs reviewed: Appointment on 10/24/2014  Component Date Value Ref Range Status  . Hgb A1c MFr Bld 10/24/2014 8.1* 4.8 - 5.6 % Final   Comment:          Pre-diabetes: 5.7 - 6.4          Diabetes: >6.4          Glycemic control for adults with diabetes: <7.0   . Est. average glucose Bld gHb Est-m* 10/24/2014 186   Final  . Glucose 10/24/2014 110* 65 - 99 mg/dL Final  . BUN 10/24/2014 30* 8 - 27 mg/dL Final  . Creatinine, Ser 10/24/2014 1.19  0.76 - 1.27 mg/dL Final  . GFR calc non Af Amer 10/24/2014 54* >59 mL/min/1.73 Final  . GFR calc Af Amer 10/24/2014 63  >59 mL/min/1.73 Final  . BUN/Creatinine Ratio 10/24/2014 25* 10 - 22 Final  . Sodium 10/24/2014 136  134 - 144 mmol/L Final  .  Potassium 10/24/2014 4.5  3.5 - 5.2 mmol/L Final  . Chloride 10/24/2014 99  97 - 108 mmol/L Final  . CO2 10/24/2014 21  18 - 29 mmol/L Final  . Calcium 10/24/2014 9.2  8.6 - 10.2 mg/dL Final  . Total Protein 10/24/2014 7.4  6.0 - 8.5 g/dL Final  . Albumin 10/24/2014 3.8  3.5 - 4.7 g/dL Final  . Globulin, Total 10/24/2014 3.6  1.5 - 4.5 g/dL Final  . Albumin/Globulin Ratio 10/24/2014 1.1  1.1 - 2.5 Final  . Bilirubin Total 10/24/2014 0.9  0.0 - 1.2 mg/dL Final  . Alkaline Phosphatase 10/24/2014 266* 39 - 117 IU/L Final  . AST 10/24/2014 23  0 - 40 IU/L Final  . ALT 10/24/2014 17  0 -  44 IU/L Final  . Digoxin Level 10/24/2014 0.7* 0.9 - 2.0 ng/mL Final   Comment: Concentrations above 2.0 ng/mL are generally considered toxic. Some overlap of toxic and non-toxic values have been reported.                             Detection Limit = 0.2 ng/mL   Appointment on 10/03/2014  Component Date Value Ref Range Status  . Glucose 10/03/2014 224* 65 - 99 mg/dL Final  . BUN 10/03/2014 42* 8 - 27 mg/dL Final  . Creatinine, Ser 10/03/2014 1.37* 0.76 - 1.27 mg/dL Final  . GFR calc non Af Amer 10/03/2014 46* >59 mL/min/1.73 Final  . GFR calc Af Amer 10/03/2014 53* >59 mL/min/1.73 Final  . BUN/Creatinine Ratio 10/03/2014 31* 10 - 22 Final  . Sodium 10/03/2014 140  134 - 144 mmol/L Final  . Potassium 10/03/2014 4.2  3.5 - 5.2 mmol/L Final  . Chloride 10/03/2014 96* 97 - 108 mmol/L Final  . CO2 10/03/2014 27  18 - 29 mmol/L Final  . Calcium 10/03/2014 9.8  8.6 - 10.2 mg/dL Final     Assessment/Plan  1. Dyspnea CXR, BNP (future)  2. Alkaline phosphatase elevation - US Abdomen Complete; Future - Comprehensive metabolic panel; Future  3. Hyperventilation syndrome Increase doxepin to 50 mg twice daily   4. Chronic combined systolic and diastolic congestive heart failure BNP, future - DG Chest 2 View; Future  5. Type 2 diabetes mellitus with mild nonproliferative diabetic retinopathy without  macular edema Advised patient he could continue to cut back some on the evening insulin dose. - Comprehensive metabolic panel; Future  6. Essential hypertension - Comprehensive metabolic panel; Future

## 2014-11-04 ENCOUNTER — Other Ambulatory Visit: Payer: Self-pay

## 2014-11-07 ENCOUNTER — Other Ambulatory Visit: Payer: Self-pay

## 2014-11-07 ENCOUNTER — Ambulatory Visit
Admission: RE | Admit: 2014-11-07 | Discharge: 2014-11-07 | Disposition: A | Discharge status: Home / Self Care | Payer: Medicare Other | Source: Ambulatory Visit | Attending: Internal Medicine | Admitting: Internal Medicine

## 2014-11-07 DIAGNOSIS — R748 Abnormal levels of other serum enzymes: Secondary | ICD-10-CM

## 2014-11-19 ENCOUNTER — Ambulatory Visit
Admission: RE | Admit: 2014-11-19 | Discharge: 2014-11-19 | Disposition: A | Discharge status: Home / Self Care | Payer: Medicare Other | Source: Ambulatory Visit | Attending: Internal Medicine | Admitting: Internal Medicine

## 2014-11-19 DIAGNOSIS — I5042 Chronic combined systolic (congestive) and diastolic (congestive) heart failure: Secondary | ICD-10-CM

## 2014-11-21 ENCOUNTER — Other Ambulatory Visit: Payer: Medicare Other

## 2014-11-21 ENCOUNTER — Ambulatory Visit (INDEPENDENT_AMBULATORY_CARE_PROVIDER_SITE_OTHER): Payer: Medicare Other | Admitting: Internal Medicine

## 2014-11-21 ENCOUNTER — Encounter: Payer: Self-pay | Admitting: Internal Medicine

## 2014-11-21 VITALS — BP 140/72 | HR 73 | Temp 97.9°F | Resp 20 | Ht 73.0 in | Wt 212.2 lb

## 2014-11-21 DIAGNOSIS — I5042 Chronic combined systolic (congestive) and diastolic (congestive) heart failure: Secondary | ICD-10-CM | POA: Diagnosis not present

## 2014-11-21 DIAGNOSIS — I10 Essential (primary) hypertension: Secondary | ICD-10-CM

## 2014-11-21 DIAGNOSIS — I5023 Acute on chronic systolic (congestive) heart failure: Secondary | ICD-10-CM

## 2014-11-21 DIAGNOSIS — R3989 Other symptoms and signs involving the genitourinary system: Secondary | ICD-10-CM

## 2014-11-21 DIAGNOSIS — R0600 Dyspnea, unspecified: Secondary | ICD-10-CM

## 2014-11-21 DIAGNOSIS — N200 Calculus of kidney: Secondary | ICD-10-CM | POA: Diagnosis not present

## 2014-11-21 DIAGNOSIS — R39198 Other difficulties with micturition: Secondary | ICD-10-CM

## 2014-11-21 DIAGNOSIS — E113299 Type 2 diabetes mellitus with mild nonproliferative diabetic retinopathy without macular edema, unspecified eye: Secondary | ICD-10-CM

## 2014-11-21 DIAGNOSIS — R748 Abnormal levels of other serum enzymes: Secondary | ICD-10-CM

## 2014-11-21 MED ORDER — TORSEMIDE 20 MG PO TABS: 20.0000 mg | ORAL_TABLET | Freq: Every day | ORAL | Status: AC

## 2014-11-21 NOTE — Progress Notes (Signed)
Patient ID: Logan Sharp, male   DOB: 06-May-1924, 81 y.o.   MRN: [id number]   Location:  [facility] / [facility] Office   Chief Complaint  Patient presents with  . Acute Visit    Trouble urinating    HPI: Patient is a 81 y.o. white male patient of Dr. Rolly Salter with h/o chronic combined CHF, alcoholism, HTN, DMII with ophthalmic complications, depression, elevated alk phos seen in the office today for acute visit (was added on) due to difficulty passing his urine.  Had difficulty passing his urine last night.  It spurted out on and off, but he could not pass a steady stream.  He did go normally this morning.   Had ultrasound of abdomen last Wednesday, 5/13:  Shows gallbladder polyp, no stones or biliary distention, small cyst on liver, bilateral renal cysts, right nephrolithiasis and bilateral pleural effusions. Wed had xray of chest showing:  Worsening of a left pleural effusion likely secondary to CHF. There may be underlying left lower lobe pneumonia or atelectasis. Follow-up radiographs following anticipated therapy are recommended. Chest CT scanning may be ultimately indicated to further evaluate the thorax.  He was taken off lasix 30m (initially reduced to 472m-was on this dose of 4084mdue to concerns about he had  it affecting his mind somehow.  Has gained a lot of weight (over 20 lbs since 10/14/14):  Has not had any cold symptoms, chills, fever, cough to suggest any pneumonia.  Is dyspneic on exertion.  Denies PND or orthopnea.  Sleeps on just one pillow.  Has had significant edema of his groin area, lower abdomen and legs.  Did not take any insulin last night.  Had an episode of his blood glucose getting too low in the cafeteria in the past few weeks.  hba1c has been up lately (8.1 4/29).  CBG 106 yesterday morning, 116 this morning.  Takes less than 12 units total per day not the dosing that was in med list.  Takes 6 units in the morning and 4-5 units at  night.  Advised today to take evening glucose reading so he knows what to do with his insulin.  Take insulin if CBGs over 120.    Review of Systems:  Review of Systems  Constitutional: Positive for malaise/fatigue. Negative for fever and chills.       Weight gain  HENT: Negative for congestion.   Respiratory: Positive for shortness of breath. Negative for cough, sputum production and wheezing.   Cardiovascular: Positive for leg swelling. Negative for chest pain and palpitations.       Also edema of scrotum, groin, lower abdomen  Gastrointestinal: Negative for abdominal pain, constipation, blood in stool and melena.  Genitourinary: Negative for dysuria, urgency, frequency, hematuria and flank pain.       Difficulty with stream last night, ok this am  Musculoskeletal: Negative for falls.  Neurological: Negative for dizziness, loss of consciousness and weakness.    Past Medical History  Diagnosis Date  . Coronary artery disease   . CHF (congestive heart failure)   . Hypertension   . 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 Chronic atrial fibrillation   . Mild aortic stenosis   . Hx of transient ischemic attack (TIA)   . Pressure ulcer, buttock(707.05)   . Impacted cerumen   . Pain in joint, ankle and foot   . Enthesopathy of hip region   . Unspecified vitamin D deficiency   .  Coronary atherosclerosis of native coronary artery   . Other malaise and fatigue   . Occlusion and stenosis of carotid artery without mention of cerebral infarction   . Abnormality of gait   . Actinic keratosis   . Benign paroxysmal positional vertigo   . Pain in joint, lower leg   . Unspecified venous (peripheral) insufficiency   . Sebaceous cyst   . Obesity, unspecified   . Depressive disorder, not elsewhere classified   . Unspecified hereditary and idiopathic peripheral neuropathy   . External hemorrhoids without mention of complication   .  Diverticulosis of colon (without mention of hemorrhage)   . Impotence of organic origin   . Unspecified essential hypertension   . Osteoarthrosis, unspecified whether generalized or localized, lower leg   . Sciatica   . Edema   . Chronic rhinitis   . Congestive heart failure, unspecified   . Effusion of joint, site unspecified   . Unspecified late effects of cerebrovascular disease   . Other and unspecified hyperlipidemia   . Anxiety state, unspecified   . Dizziness and giddiness   . Benign neoplasm of colon   . Atrial fibrillation   . Anemia, unspecified   . Acquired cyst of kidney   . Right bundle branch block     Past Surgical History  Procedure Laterality Date  . Kidney cyst removal    . Colon surgery      x2 Dr Rebekah Chesterfield   . Cadiac cath  07/06/2008    DR JORDON  . Circumcision  1987  . Knee arthroscopy  1992    right                                  . Nephrectomy  1992    right partial and excision of cyst  . Aspiration / injection renal cyst  1993  . Cataract extraction, bilateral  1998 (R) 2000(L)    Dr Gershon Crane  . 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 Darvocet [Propoxyphene N-Acetaminophen]   . Daypro [Oxaprozin]   . Effexor [Venlafaxine Hcl]   . Glucosamine Chondroitin Adv [Cholestatin]   . Lorcet Plus [Hydrocodone-Acetaminophen]   . Propulsid [Cisapride]   . Tenormin [Atenolol]   . Wellbutrin [Bupropion]   . Zoloft [Sertraline Hcl]    Medications: Patient's Medications  New Prescriptions   No medications on file  Previous Medications   ASPIRIN EC 81 MG TABLET    One daily to prevent stroke or heart attack.   BUSPIRONE (BUSPAR) 15 MG TABLET    One up to twice daily to help anxiety AS NEEDED ONLY   CHOLECALCIFEROL (VITAMIN D) 1000 UNITS TABLET    Take 1,000 Units by mouth daily.   COMBIGAN 0.2-0.5 % OPHTHALMIC SOLUTION    Place 1 drop into both eyes 2 (two) times daily.   DIGOXIN (LANOXIN) 0.25 MG TABLET     Take 1 tablet (250 mcg total) by mouth daily.   DOXEPIN (SINEQUAN) 50 MG CAPSULE    Take 50 mg by mouth. One twice daily to help relax   FUROSEMIDE (LASIX) 80 MG TABLET    Take one tablet by mouth once daily for edema   INSULIN ASPART PROTAMINE-INSULIN ASPART (NOVOLOG MIX 70/30) (70-30) 100 UNIT/ML INJECTION    Inject 20-25 units before breakfast and 15-20 units before supper to contro diabetes.   LOSARTAN-HYDROCHLOROTHIAZIDE (HYZAAR) 100-25  MG PER TABLET    One daily to control blood pressure  Modified Medications   No medications on file  Discontinued Medications   No medications on file    Physical Exam: Filed Vitals:   11/21/14 1002  BP: 140/72  Pulse: 73  Temp: 97.9 F (36.6 C)  TempSrc: Oral  Resp: 20  Height: _0  (1.854 m)  Weight: 212 lb 3.2 oz (96.253 kg)  SpO2: 95%   Physical Exam  Constitutional: He is oriented to person, place, and time. He appears well-developed and well-nourished. No distress.  Cardiovascular: Normal heart sounds and intact distal pulses.   irreg irreg; 2+ pitting edema of bilateral LEs with mild chronic venous insufficiency changes  Pulmonary/Chest: Effort normal. No respiratory distress.  Dyspneic walking in from waiting room to exam room; diminished breath sounds bilateral bases  Abdominal: Soft. Bowel sounds are normal. He exhibits distension. He exhibits no mass. There is no tenderness.  Musculoskeletal: Normal range of motion.  Neurological: He is alert and oriented to person, place, and time.  Psychiatric: He has a normal mood and affect.    Labs reviewed: Basic Metabolic Panel:  Recent Labs  01/29/14 1324 03/12/14 1710 10/03/14 1449 10/24/14 0928  NA 136 138 140 136  K 4.0 3.7 4.2 4.5  CL 95* 94* 96* 99  CO2 _1 GLUCOSE 234* 158* 224* 110*  BUN 23 28* 42* 30*  CREATININE 1.17 1.35* 1.37* 1.19  CALCIUM 10.0 9.8 9.8 9.2  TSH 2.560  --   --   --    Liver Function Tests:  Recent Labs  01/29/14 1324 03/12/14 1710  10/24/14 0928  AST _2 ALT _3 ALKPHOS 200* 230* 266*  BILITOT 0.9 1.1 0.9  PROT 7.2 7.5 7.4   No results for input(s): LIPASE, AMYLASE in the last 8760 hours. No results for input(s): AMMONIA in the last 8760 hours. CBC: No results for input(s): WBC, NEUTROABS, HGB, HCT, MCV, PLT in the last 8760 hours. Lipid Panel:  Recent Labs  03/10/14 0957  CHOL 115  HDL 37*  LDLCALC 66  TRIG 58  CHOLHDL 3.1   Lab Results  Component Value Date   HGBA1C 8.1* 10/24/2014   Assessment/Plan 1. Difficulty urinating -seems this is related to anasarca with edema of his penis/peri-area -CMP already ordered for before his appt 6/1 so will have done today along with BNP - added PSA -due to nephrolithiasis on his [place] of abdomen, I also ordered UA but he doesn't think he can make sample (will be sent with cup) primarily to check for blood--he has not seen any  2. Acute on chronic systolic CHF (congestive heart failure) -has gained about 20 lbs over past few mos since lasix reduced, then stopped -weighed 02/06/14, he weighed 213.4, lost to 189.4lbs on 4/19, but back up to 212.2 lbs today with overt edema of legs, bilateral pleural effusions on xray last week -will try to get torsemide covered instead of furosemide b/c he did not feel well on furosemide and torsemide should not affect his kidneys as much -recommend f/u bmp when seen next Tuesday -of note,he is a recovered alcoholic also  3. Chronic combined systolic and diastolic congestive heart failure -has been off diuretics and now in acute chf; see above  4. Right nephrolithiasis -seen on [place] so check UA to r/o hematuria - Urinalysis with Reflex Microscopic  Labs/tests ordered:  Cmp, UA with reflex, PSA, BNP done today Next  appt:  6/1 with Dr. Nyoka Cowden  Bryah [place] L. Irena Gaydos, D.O. [facility] [address], [place] [address] Cell Phone (Mon-Fri 8am-5pm):  [phone] On Call:   [phone] & follow prompts after 5pm & weekends Office Phone:  [phone] Office Fax:  [phone]

## 2014-11-21 NOTE — Patient Instructions (Addendum)
Weigh yourself daily between now and 6/1 and bring readings for Dr. Nyoka Cowden. We checked labs today and I would like you to get your electrolytes repeated next week when you see him.   Start torsemide 20mg  daily.   Also take your sugar twice a day before you take your insulin.  If your sugar is under 120, then sdo not take your insulin.  Use 6 units in the morning and 5 in the evening if over 120.

## 2014-11-22 LAB — COMPREHENSIVE METABOLIC PANEL
ALT: 14 IU/L (ref 0–44)
AST: 22 IU/L (ref 0–40)
Albumin/Globulin Ratio: 1.1 (ref 1.1–2.5)
Albumin: 3.8 g/dL (ref 3.5–4.7)
Alkaline Phosphatase: 244 IU/L — ABNORMAL HIGH (ref 39–117)
BUN/Creatinine Ratio: 14 (ref 10–22)
BUN: 15 mg/dL (ref 8–27)
Bilirubin Total: 1 mg/dL (ref 0.0–1.2)
CALCIUM: 9.6 mg/dL (ref 8.6–10.2)
CHLORIDE: 94 mmol/L — AB (ref 97–108)
CO2: 23 mmol/L (ref 18–29)
Creatinine, Ser: 1.08 mg/dL (ref 0.76–1.27)
GFR calc Af Amer: 70 mL/min/{1.73_m2} (ref >59)
GFR calc non Af Amer: 61 mL/min/{1.73_m2} (ref >59)
Globulin, Total: 3.6 g/dL (ref 1.5–4.5)
Glucose: 116 mg/dL — ABNORMAL HIGH (ref 65–99)
Potassium: 4.3 mmol/L (ref 3.5–5.2)
Sodium: 135 mmol/L (ref 134–144)
Total Protein: 7.4 g/dL (ref 6.0–8.5)

## 2014-11-22 LAB — URINALYSIS, ROUTINE W REFLEX MICROSCOPIC
Bilirubin, UA: NEGATIVE
Glucose, UA: NEGATIVE
Ketones, UA: NEGATIVE
Leukocytes, UA: NEGATIVE
Nitrite, UA: NEGATIVE
Protein, UA: NEGATIVE
RBC, UA: NEGATIVE
Specific Gravity, UA: 1.011 (ref 1.005–1.030)
Urobilinogen, Ur: 1 mg/dL (ref 0.2–1.0)
pH, UA: 5.5 (ref 5.0–7.5)

## 2014-11-22 LAB — PSA: Prostate Specific Ag, Serum: 1.4 ng/mL (ref 0.0–4.0)

## 2014-11-22 LAB — HEMOGLOBIN A1C
Est. average glucose Bld gHb Est-mCnc: 160 mg/dL
HEMOGLOBIN A1C: 7.2 % — AB (ref 4.8–5.6)

## 2014-11-22 LAB — BRAIN NATRIURETIC PEPTIDE: BNP: 835.8 pg/mL — ABNORMAL HIGH (ref 0.0–100.0)

## 2014-11-26 ENCOUNTER — Encounter: Payer: Self-pay | Admitting: Internal Medicine

## 2014-11-26 ENCOUNTER — Ambulatory Visit (INDEPENDENT_AMBULATORY_CARE_PROVIDER_SITE_OTHER): Payer: Medicare Other | Admitting: Internal Medicine

## 2014-11-26 VITALS — BP 120/64 | HR 57 | Temp 98.0°F | Ht 73.0 in | Wt 202.2 lb

## 2014-11-26 DIAGNOSIS — I4891 Unspecified atrial fibrillation: Secondary | ICD-10-CM | POA: Insufficient documentation

## 2014-11-26 DIAGNOSIS — R748 Abnormal levels of other serum enzymes: Secondary | ICD-10-CM

## 2014-11-26 DIAGNOSIS — R5381 Other malaise: Secondary | ICD-10-CM

## 2014-11-26 DIAGNOSIS — E113299 Type 2 diabetes mellitus with mild nonproliferative diabetic retinopathy without macular edema, unspecified eye: Secondary | ICD-10-CM

## 2014-11-26 DIAGNOSIS — I482 Chronic atrial fibrillation, unspecified: Secondary | ICD-10-CM

## 2014-11-26 DIAGNOSIS — R609 Edema, unspecified: Secondary | ICD-10-CM | POA: Diagnosis not present

## 2014-11-26 DIAGNOSIS — I5042 Chronic combined systolic (congestive) and diastolic (congestive) heart failure: Secondary | ICD-10-CM

## 2014-11-26 DIAGNOSIS — R0600 Dyspnea, unspecified: Secondary | ICD-10-CM | POA: Diagnosis not present

## 2014-11-26 DIAGNOSIS — E669 Obesity, unspecified: Secondary | ICD-10-CM

## 2014-11-26 DIAGNOSIS — I10 Essential (primary) hypertension: Secondary | ICD-10-CM

## 2014-11-26 DIAGNOSIS — E11329 Type 2 diabetes mellitus with mild nonproliferative diabetic retinopathy without macular edema: Secondary | ICD-10-CM

## 2014-11-26 NOTE — Progress Notes (Signed)
Patient ID: Logan Sharp, male   DOB: 06-01-1924, 81 y.o.   MRN: [id number]    Facility  [facility]    Place of Service:   OFFICE    Allergies  Allergen Reactions  . Darvocet [Propoxyphene N-Acetaminophen]   . Daypro [Oxaprozin]   . Effexor [Venlafaxine Hcl]   . Glucosamine Chondroitin Adv [Cholestatin]   . Lorcet Plus [Hydrocodone-Acetaminophen]   . Propulsid [Cisapride]   . Tenormin [Atenolol]   . Wellbutrin [Bupropion]   . Zoloft [Sertraline Hcl]     Chief Complaint  Patient presents with  . Follow-up    4 week follow-up, feels terrible, decreased mobility, fluid build up in legs and ankles.     HPI:  Chronic combined systolic and diastolic congestive heart failure: diuresing and beginning to feel better, but still remains weak  Type 2 diabetes mellitus with mild nonproliferative diabetic retinopathy without macular edema: improved  Essential hypertension: controlled  Edema: 2+ bilateral  Alkaline phosphatase elevation: chronic and stable  Malaise: related to CHF and depression  Obesity: losing fluid weight and other body weight due to diuretic and poor appetite  Chronic atrial fibrillation: rate controlled. Not anticoagulated due to risk of falling.    Medications: Patient's Medications  New Prescriptions   No medications on file  Previous Medications   ASPIRIN EC 81 MG TABLET    One daily to prevent stroke or heart attack.   BUSPIRONE (BUSPAR) 15 MG TABLET    One up to twice daily to help anxiety AS NEEDED ONLY   CHOLECALCIFEROL (VITAMIN D) 1000 UNITS TABLET    Take 1,000 Units by mouth daily.   COMBIGAN 0.2-0.5 % OPHTHALMIC SOLUTION    Place 1 drop into both eyes 2 (two) times daily.   DIGOXIN (LANOXIN) 0.25 MG TABLET    Take 1 tablet (250 mcg total) by mouth daily.   DOXEPIN (SINEQUAN) 50 MG CAPSULE    Take 50 mg by mouth. One twice daily to help relax   INSULIN ASPART PROTAMINE-INSULIN ASPART (NOVOLOG MIX 70/30) (70-30) 100 UNIT/ML INJECTION    Inject  20-25 units before breakfast and 15-20 units before supper to contro diabetes.   LOSARTAN-HYDROCHLOROTHIAZIDE (HYZAAR) 100-25 MG PER TABLET    One daily to control blood pressure   TORSEMIDE (DEMADEX) 20 MG TABLET    Take 1 tablet (20 mg total) by mouth daily.  Modified Medications   No medications on file  Discontinued Medications   FUROSEMIDE (LASIX) 80 MG TABLET    Take one tablet by mouth once daily for edema     Review of Systems  Constitutional: Positive for activity change, appetite change, fatigue and unexpected weight change. Negative for fever.  HENT: Positive for hearing loss and tinnitus (right).   Eyes: Negative.        Wears prescription lenses.  Respiratory: Positive for shortness of breath (with exertion).   Cardiovascular: Positive for leg swelling. Negative for chest pain and palpitations.  Gastrointestinal: Negative.   Endocrine: Negative.        Patient is diabetic.  Genitourinary: Negative.   Musculoskeletal: Positive for myalgias, back pain, arthralgias and gait problem. Negative for joint swelling.       Pain right knee.  Skin:       healed ulcer of the left lateral ankle.  Allergic/Immunologic: Negative.   Neurological: Negative.   Hematological: Negative.   Psychiatric/Behavioral:       Chronic anxiety. Mild depression.    Filed Vitals:   11/26/14 1122  BP:  120/64  Pulse: 57  Temp: 98 F (36.7 C)  TempSrc: Oral  Height: 6\' 1"  (1.854 m)  Weight: 202 lb 3.2 oz (91.717 kg)  SpO2: 99%   Body mass index is 26.68 kg/(m^2).  Physical Exam  Constitutional: He is oriented to person, place, and time.  Moderately obese. No acute distress.  HENT:  Head: Normocephalic.  Nose: Nose normal.  Mouth/Throat: Oropharynx is clear and moist. No oropharyngeal exudate.  Mild hearing loss.  Eyes: Conjunctivae and EOM are normal. Pupils are equal, round, and reactive to light.  Neck: Normal range of motion. Neck supple. No JVD present. No tracheal deviation  present. No thyromegaly present.  Cardiovascular: Exam reveals no gallop and no friction rub.   No murmur heard. Irregular rhythm. 3/6 SEM at [place].  Pulmonary/Chest: Effort normal. No respiratory distress. He has no wheezes. He has rales. He exhibits no tenderness.  Abdominal: Soft. Bowel sounds are normal. He exhibits no distension and no mass. There is no tenderness.  Musculoskeletal: He exhibits edema (2+ bipedal). He exhibits no tenderness.  Small effusion right knee. Unable to fully extend right knee and lower leg.  Lymphadenopathy:    He has no cervical adenopathy.  Neurological: He is alert and oriented to person, place, and time. No cranial nerve deficit. Coordination normal.  Skin: Skin is warm and dry. No rash noted.  Shallow ulcer of the left lateral malleolus remains active. Skin tear of the left upper calf. Weeping due to edema.  Psychiatric: His behavior is normal. Judgment and thought content normal.  Dysphoric mood     Labs reviewed: Office Visit on 11/21/2014  Component Date Value Ref Range Status  . Specific Gravity, UA 11/21/2014 1.011  1.005 - 1.030 Final  . pH, UA 11/21/2014 5.5  5.0 - 7.5 Final  . Color, UA 11/21/2014 Yellow  Yellow Final  . Appearance Ur 11/21/2014 Clear  Clear Final  . Leukocytes, UA 11/21/2014 Negative  Negative Final  . Protein, UA 11/21/2014 Negative  Negative/Trace Final  . Glucose, UA 11/21/2014 Negative  Negative Final  . Ketones, UA 11/21/2014 Negative  Negative Final  . RBC, UA 11/21/2014 Negative  Negative Final  . Bilirubin, UA 11/21/2014 Negative  Negative Final  . Urobilinogen, Ur 11/21/2014 1.0  0.2 - 1.0 mg/dL Final  . Nitrite, UA 11/21/2014 Negative  Negative Final  . Microscopic Examination 11/21/2014 Comment   Final   Microscopic not indicated and not performed.  . Prostate Specific Ag, Serum 11/21/2014 1.4  0.0 - 4.0 ng/mL Final   Comment: Roche ECLIA methodology. According to the American U[employer] Association, Serum  PSA should decrease and remain at undetectable levels after radical prostatectomy. The AUA defines biochemical recurrence as an initial PSA value 0.2 ng/mL or greater followed by a subsequent confirmatory PSA value 0.2 ng/mL or greater. Values obtained with different assay methods or kits cannot be used interchangeably. Results cannot be interpreted as absolute evidence of the presence or absence of malignant disease.   Appointment on 11/21/2014  Component Date Value Ref Range Status  . Glucose 11/21/2014 116* 65 - 99 mg/dL Final  . BUN 11/21/2014 15  8 - 27 mg/dL Final  . Creatinine, Ser 11/21/2014 1.08  0.76 - 1.27 mg/dL Final  . GFR calc non Af Amer 11/21/2014 61  >59 mL/min/1.73 Final  . GFR calc Af Amer 11/21/2014 70  >59 mL/min/1.73 Final  . BUN/Creatinine Ratio 11/21/2014 14  10 - 22 Final  . Sodium 11/21/2014 135  134 - 144  mmol/L Final  . Potassium 11/21/2014 4.3  3.5 - 5.2 mmol/L Final  . Chloride 11/21/2014 94* 97 - 108 mmol/L Final  . CO2 11/21/2014 23  18 - 29 mmol/L Final  . Calcium 11/21/2014 9.6  8.6 - 10.2 mg/dL Final  . Total Protein 11/21/2014 7.4  6.0 - 8.5 g/dL Final  . Albumin 11/21/2014 3.8  3.5 - 4.7 g/dL Final  . Globulin, Total 11/21/2014 3.6  1.5 - 4.5 g/dL Final  . Albumin/Globulin Ratio 11/21/2014 1.1  1.1 - 2.5 Final  . Bilirubin Total 11/21/2014 1.0  0.0 - 1.2 mg/dL Final  . Alkaline Phosphatase 11/21/2014 244* 39 - 117 IU/L Final  . AST 11/21/2014 22  0 - 40 IU/L Final  . ALT 11/21/2014 14  0 - 44 IU/L Final  . BNP 11/21/2014 835.8* 0.0 - 100.0 pg/mL Final  . Hgb A1c MFr Bld 11/21/2014 7.2* 4.8 - 5.6 % Final   Comment:          Pre-diabetes: 5.7 - 6.4          Diabetes: >6.4          Glycemic control for adults with diabetes: <7.0   . Est. average glucose Bld gHb Est-m* 11/21/2014 160   Final  Appointment on 10/24/2014  Component Date Value Ref Range Status  . Hgb A1c MFr Bld 10/24/2014 8.1* 4.8 - 5.6 % Final   Comment:          Pre-diabetes:  5.7 - 6.4          Diabetes: >6.4          Glycemic control for adults with diabetes: <7.0   . Est. average glucose Bld gHb Est-m* 10/24/2014 186   Final  . Glucose 10/24/2014 110* 65 - 99 mg/dL Final  . BUN 10/24/2014 30* 8 - 27 mg/dL Final  . Creatinine, Ser 10/24/2014 1.19  0.76 - 1.27 mg/dL Final  . GFR calc non Af Amer 10/24/2014 54* >59 mL/min/1.73 Final  . GFR calc Af Amer 10/24/2014 63  >59 mL/min/1.73 Final  . BUN/Creatinine Ratio 10/24/2014 25* 10 - 22 Final  . Sodium 10/24/2014 136  134 - 144 mmol/L Final  . Potassium 10/24/2014 4.5  3.5 - 5.2 mmol/L Final  . Chloride 10/24/2014 99  97 - 108 mmol/L Final  . CO2 10/24/2014 21  18 - 29 mmol/L Final  . Calcium 10/24/2014 9.2  8.6 - 10.2 mg/dL Final  . Total Protein 10/24/2014 7.4  6.0 - 8.5 g/dL Final  . Albumin 10/24/2014 3.8  3.5 - 4.7 g/dL Final  . Globulin, Total 10/24/2014 3.6  1.5 - 4.5 g/dL Final  . Albumin/Globulin Ratio 10/24/2014 1.1  1.1 - 2.5 Final  . Bilirubin Total 10/24/2014 0.9  0.0 - 1.2 mg/dL Final  . Alkaline Phosphatase 10/24/2014 266* 39 - 117 IU/L Final  . AST 10/24/2014 23  0 - 40 IU/L Final  . ALT 10/24/2014 17  0 - 44 IU/L Final  . Digoxin Level 10/24/2014 0.7* 0.9 - 2.0 ng/mL Final   Comment: Concentrations above 2.0 ng/mL are generally considered toxic. Some overlap of toxic and non-toxic values have been reported.                             Detection Limit = 0.2 ng/mL   Appointment on 10/03/2014  Component Date Value Ref Range Status  . Glucose 10/03/2014 224* 65 - 99 mg/dL Final  . BUN  10/03/2014 42* 8 - 27 mg/dL Final  . Creatinine, Ser 10/03/2014 1.37* 0.76 - 1.27 mg/dL Final  . GFR calc non Af Amer 10/03/2014 46* >59 mL/min/1.73 Final  . GFR calc Af Amer 10/03/2014 53* >59 mL/min/1.73 Final  . BUN/Creatinine Ratio 10/03/2014 31* 10 - 22 Final  . Sodium 10/03/2014 140  134 - 144 mmol/L Final  . Potassium 10/03/2014 4.2  3.5 - 5.2 mmol/L Final  . Chloride 10/03/2014 96* 97 - 108 mmol/L  Final  . CO2 10/03/2014 27  18 - 29 mmol/L Final  . Calcium 10/03/2014 9.8  8.6 - 10.2 mg/dL Final     Assessment/Plan  1. Chronic combined systolic and diastolic congestive heart failure Continue current medication - ECHOCARDIOGRAM COMPLETE; Future  2. Type 2 diabetes mellitus with mild nonproliferative diabetic retinopathy without macular edema Current medications  3. Essential hypertension Controlled and actually on the much lower side than in the past.  4. Edema Improving  5. Alkaline phosphatase elevation Continue follow-up. Ultrasound of the abdomen on 11/07/2014 showed a 7 mm gallbladder polyp but no stones. There was no biliary distention. Liver was otherwise unremarkable. Bilateral pleural effusions, bilateral renal cysts, and right nephrolithiasis were found incidentally.  6. Malaise I think mainly attributable to his congestive heart failure. He is beginning to feel a little bit better but remains weak.  7. Obesity Patient's lost a lot of weight related to his diuresis. His appetite has been poor and his weight loss probably represent some degree of true weight loss as well.  8. Chronic atrial fibrillation Rate controlled  9. Dyspnea Related to recent deterioration in congestive heart failure.

## 2014-12-12 ENCOUNTER — Ambulatory Visit (HOSPITAL_COMMUNITY): Payer: Medicare Other | Attending: Cardiovascular Disease

## 2014-12-12 ENCOUNTER — Other Ambulatory Visit: Payer: Self-pay

## 2014-12-12 ENCOUNTER — Other Ambulatory Visit: Payer: Medicare Other

## 2014-12-12 DIAGNOSIS — I5042 Chronic combined systolic (congestive) and diastolic (congestive) heart failure: Secondary | ICD-10-CM | POA: Diagnosis not present

## 2014-12-12 DIAGNOSIS — I517 Cardiomegaly: Secondary | ICD-10-CM | POA: Diagnosis not present

## 2014-12-12 DIAGNOSIS — I313 Pericardial effusion (noninflammatory): Secondary | ICD-10-CM | POA: Diagnosis not present

## 2014-12-12 DIAGNOSIS — R609 Edema, unspecified: Secondary | ICD-10-CM

## 2014-12-12 DIAGNOSIS — I071 Rheumatic tricuspid insufficiency: Secondary | ICD-10-CM | POA: Insufficient documentation

## 2014-12-12 DIAGNOSIS — I340 Nonrheumatic mitral (valve) insufficiency: Secondary | ICD-10-CM | POA: Diagnosis not present

## 2014-12-12 DIAGNOSIS — I350 Nonrheumatic aortic (valve) stenosis: Secondary | ICD-10-CM | POA: Insufficient documentation

## 2014-12-12 DIAGNOSIS — R0600 Dyspnea, unspecified: Secondary | ICD-10-CM

## 2014-12-12 DIAGNOSIS — I10 Essential (primary) hypertension: Secondary | ICD-10-CM

## 2014-12-12 DIAGNOSIS — I059 Rheumatic mitral valve disease, unspecified: Secondary | ICD-10-CM | POA: Diagnosis not present

## 2014-12-12 DIAGNOSIS — R5381 Other malaise: Secondary | ICD-10-CM

## 2014-12-12 DIAGNOSIS — R748 Abnormal levels of other serum enzymes: Secondary | ICD-10-CM

## 2014-12-12 DIAGNOSIS — E113299 Type 2 diabetes mellitus with mild nonproliferative diabetic retinopathy without macular edema, unspecified eye: Secondary | ICD-10-CM

## 2014-12-13 LAB — COMPREHENSIVE METABOLIC PANEL
ALBUMIN: 3.8 g/dL (ref 3.5–4.7)
ALT: 14 IU/L (ref 0–44)
AST: 20 IU/L (ref 0–40)
Albumin/Globulin Ratio: 1 — ABNORMAL LOW (ref 1.1–2.5)
Alkaline Phosphatase: 234 IU/L — ABNORMAL HIGH (ref 39–117)
BILIRUBIN TOTAL: 0.9 mg/dL (ref 0.0–1.2)
BUN/Creatinine Ratio: 27 — ABNORMAL HIGH (ref 10–22)
BUN: 35 mg/dL — AB (ref 8–27)
CALCIUM: 10 mg/dL (ref 8.6–10.2)
CO2: 26 mmol/L (ref 18–29)
Chloride: 96 mmol/L — ABNORMAL LOW (ref 97–108)
Creatinine, Ser: 1.32 mg/dL — ABNORMAL HIGH (ref 0.76–1.27)
GFR calc non Af Amer: 48 mL/min/{1.73_m2} — ABNORMAL LOW (ref >59)
GFR, EST AFRICAN AMERICAN: 55 mL/min/{1.73_m2} — AB (ref >59)
GLOBULIN, TOTAL: 3.8 g/dL (ref 1.5–4.5)
GLUCOSE: 117 mg/dL — AB (ref 65–99)
POTASSIUM: 4.2 mmol/L (ref 3.5–5.2)
Sodium: 140 mmol/L (ref 134–144)
TOTAL PROTEIN: 7.6 g/dL (ref 6.0–8.5)

## 2014-12-13 LAB — BRAIN NATRIURETIC PEPTIDE: BNP: 675.3 pg/mL — ABNORMAL HIGH (ref 0.0–100.0)

## 2014-12-16 ENCOUNTER — Encounter: Payer: Self-pay | Admitting: Internal Medicine

## 2014-12-16 ENCOUNTER — Ambulatory Visit: Payer: Medicare Other | Admitting: Internal Medicine

## 2014-12-26 DEATH — deceased

## 2015-09-13 IMAGING — US US ABDOMEN COMPLETE
1 series · 13 of 25 positions shown · non-contrast
Comparison: None.

CLINICAL DATA: Elevated alkaline phosphatase.

EXAM:
ULTRASOUND ABDOMEN COMPLETE

[Series 1: us abdomen complete · 0.32mm/px · 13 of 108 slices shown]
[im 1/108]
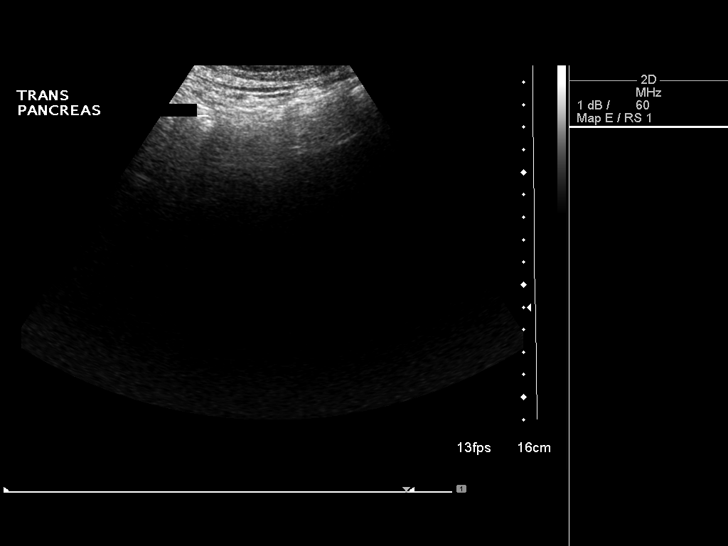
[im 9/108]
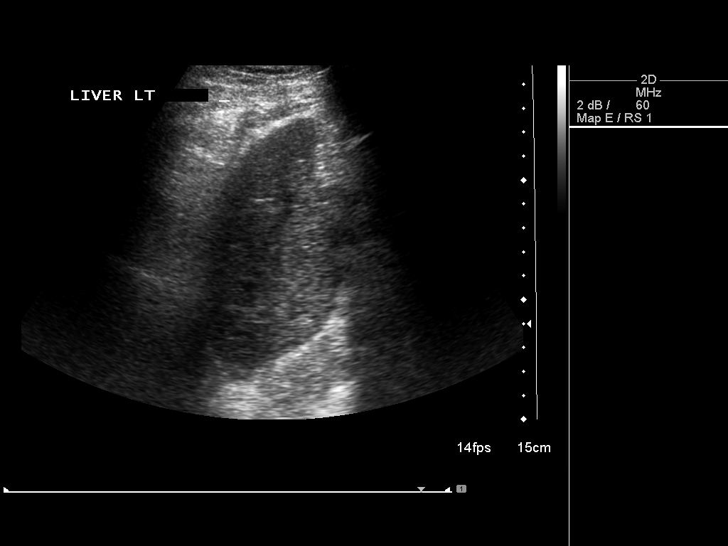
[im 18/108]
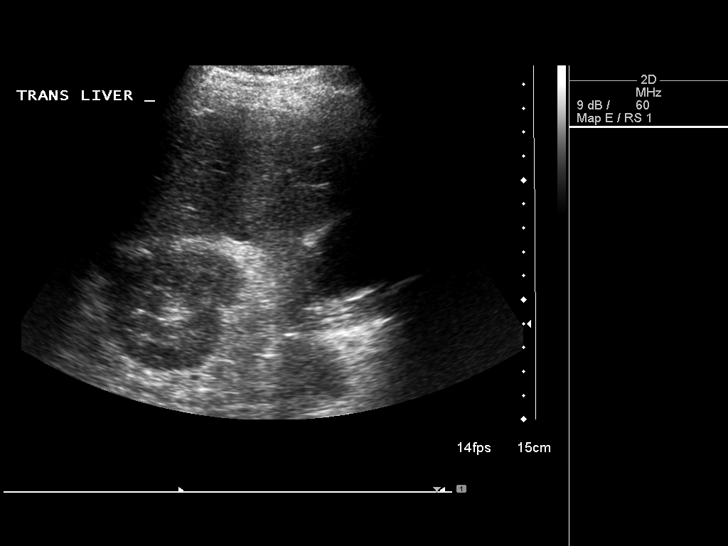
[im 27/108]
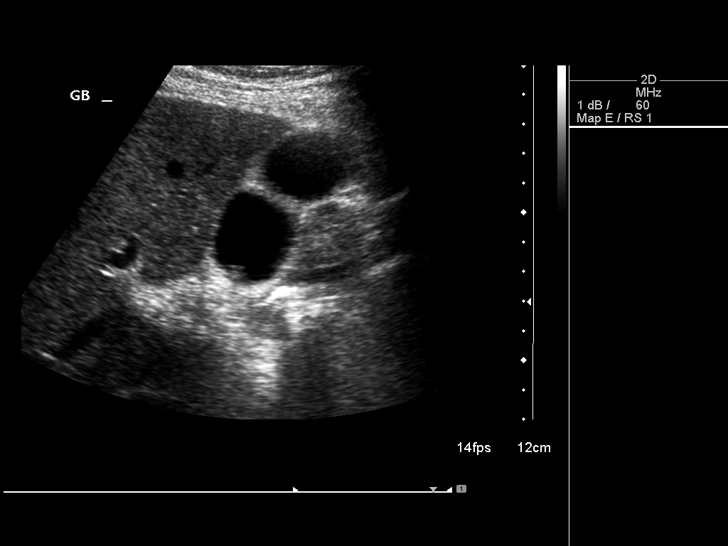
[im 36/108]
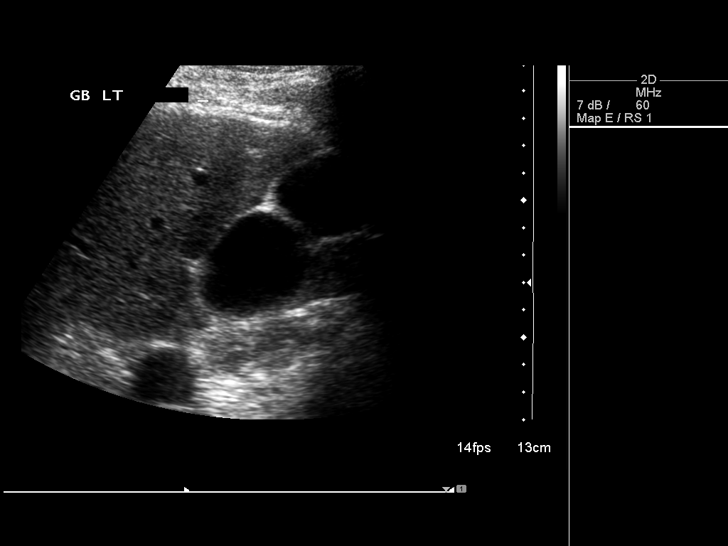
[im 45/108]
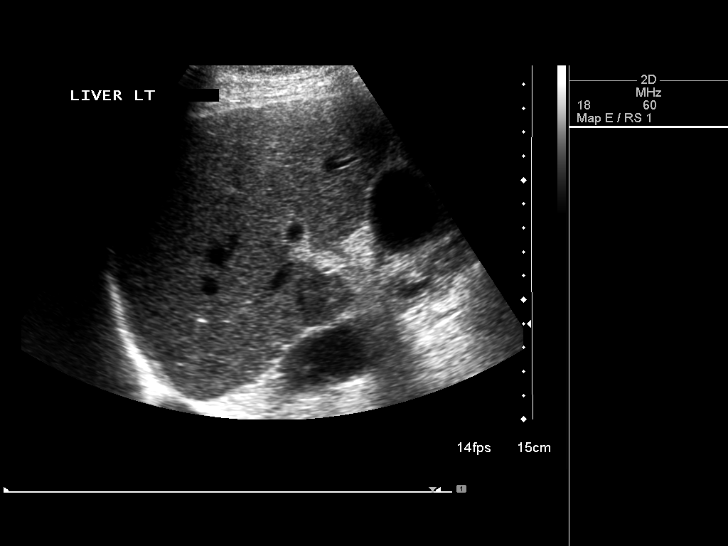
[im 54/108]
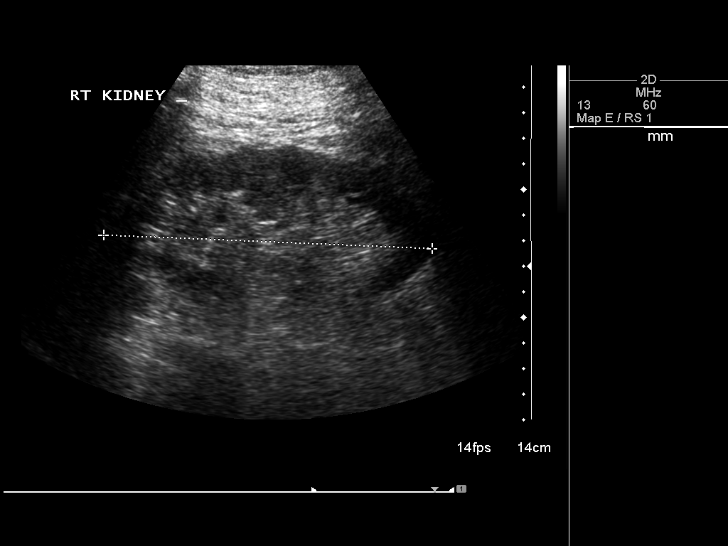
[im 63/108]
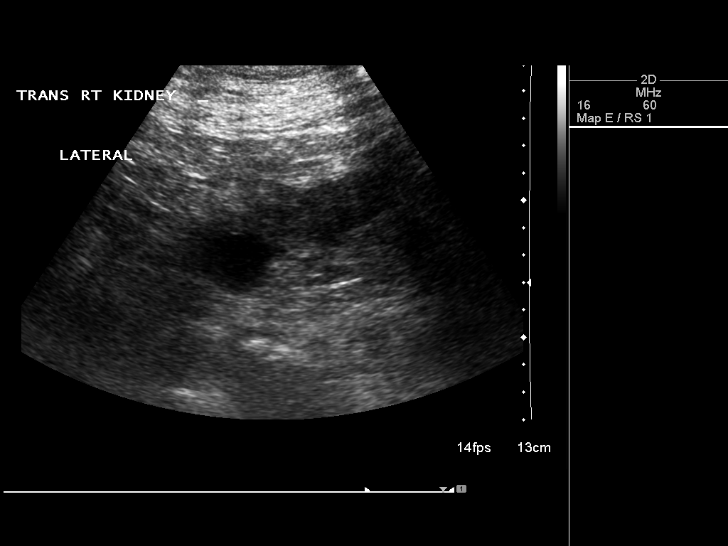
[im 72/108]
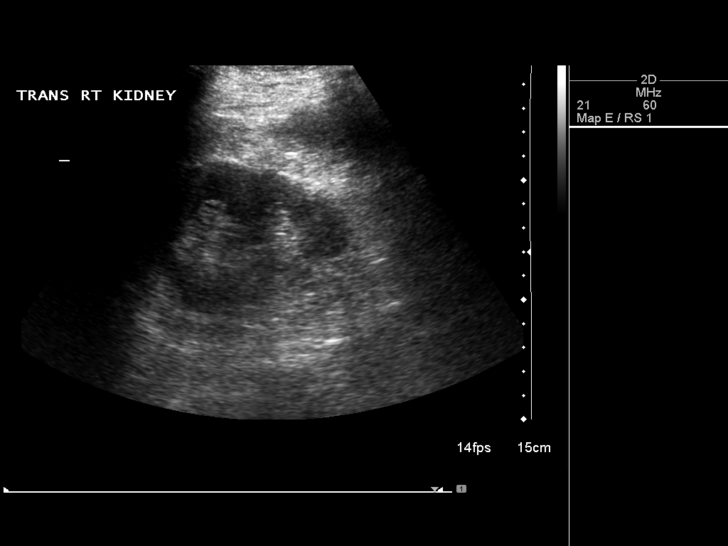
[im 81/108]
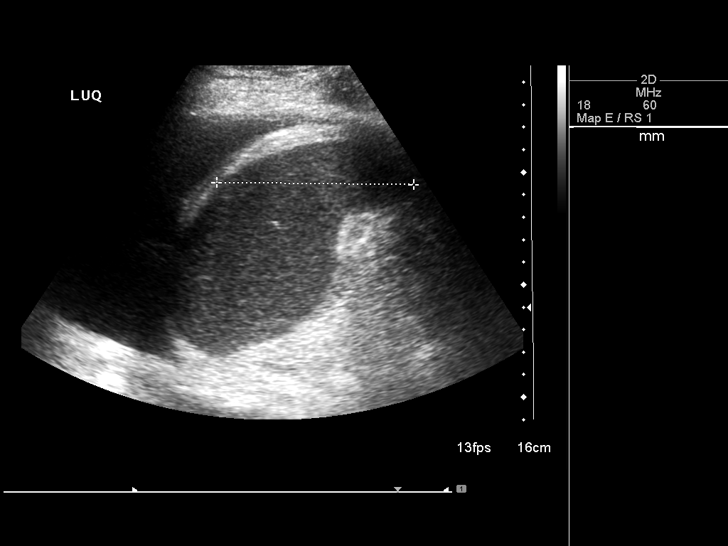
[im 90/108]
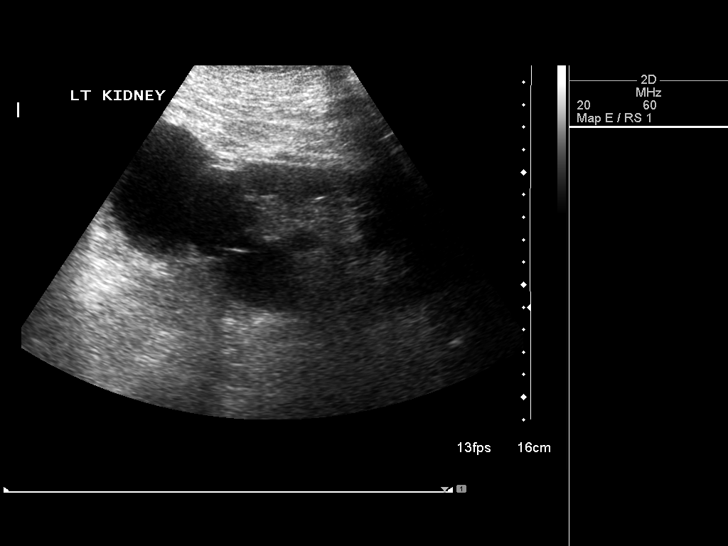
[im 99/108]
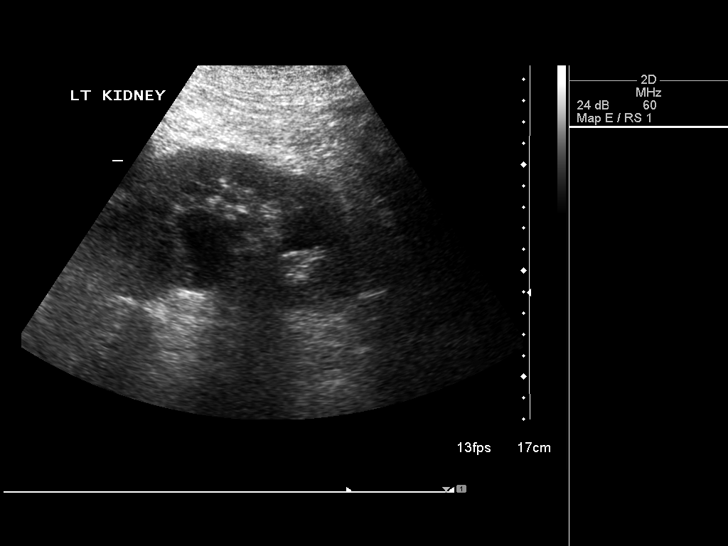
[im 108/108]
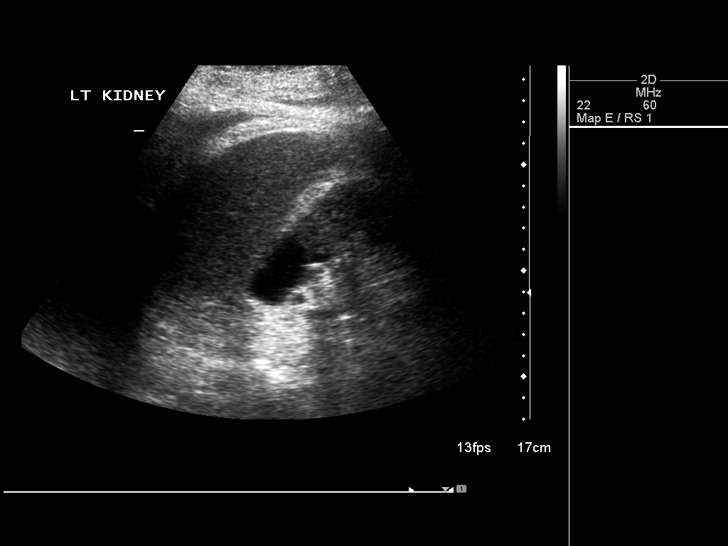

[13 of 25 positions shown; findings below may reference images not displayed]

FINDINGS: Gallbladder: 7 mm polyp noted within the gallbladder. No gallstones
noted. Gallbladder wall thickness 1.5 mm. Negative Murphy sign.

Common bile duct: Diameter: 3.5 mm

Liver: Small exophytic 1.9 cm cyst. No significant focal hepatic
abnormality identified.

IVC: No abnormality visualized.

Pancreas: Visualized portion unremarkable.

Spleen: Size and appearance within normal limits.

Right Kidney: Length: 12.7 cm. Multiple simple cysts are noted.
Largest measures 2.7 cm. 8 mm echogenic caliceal density mid
shadowing consistent with stone.. Echogenicity within normal limits.
No mass or hydronephrosis visualized.

Left Kidney: Length: 11.8 cm. Multiple cysts are noted. A large
cm exophytic cyst in the left upper pole.. Echogenicity within
normal limits. No mass or hydronephrosis visualized.

Abdominal aorta: No aneurysm visualized.

Other findings: Bilateral pleural effusions.
IMPRESSION: 1. 7 mm gallbladder polyp.  No stones.  No biliary distention.
2. Small exophytic cyst noted arising from the liver. Liver is
otherwise unremarkable. No significant focal hepatic abnormality.
3. Bilateral renal cyst.  Right nephrolithiasis.
4. Bilateral pleural effusions.

## 2015-09-25 IMAGING — CR DG CHEST 2V
2 series · 2 of 2 positions shown · non-contrast
Comparison: PA and lateral chest of June 06, 2008

CLINICAL DATA: Dyspnea, bilateral lower leg swelling, duration of
symptoms several weeks, history of CHF and diabetes and previous
tobacco use.

EXAM:
CHEST  2 VIEW

[w chest pa]
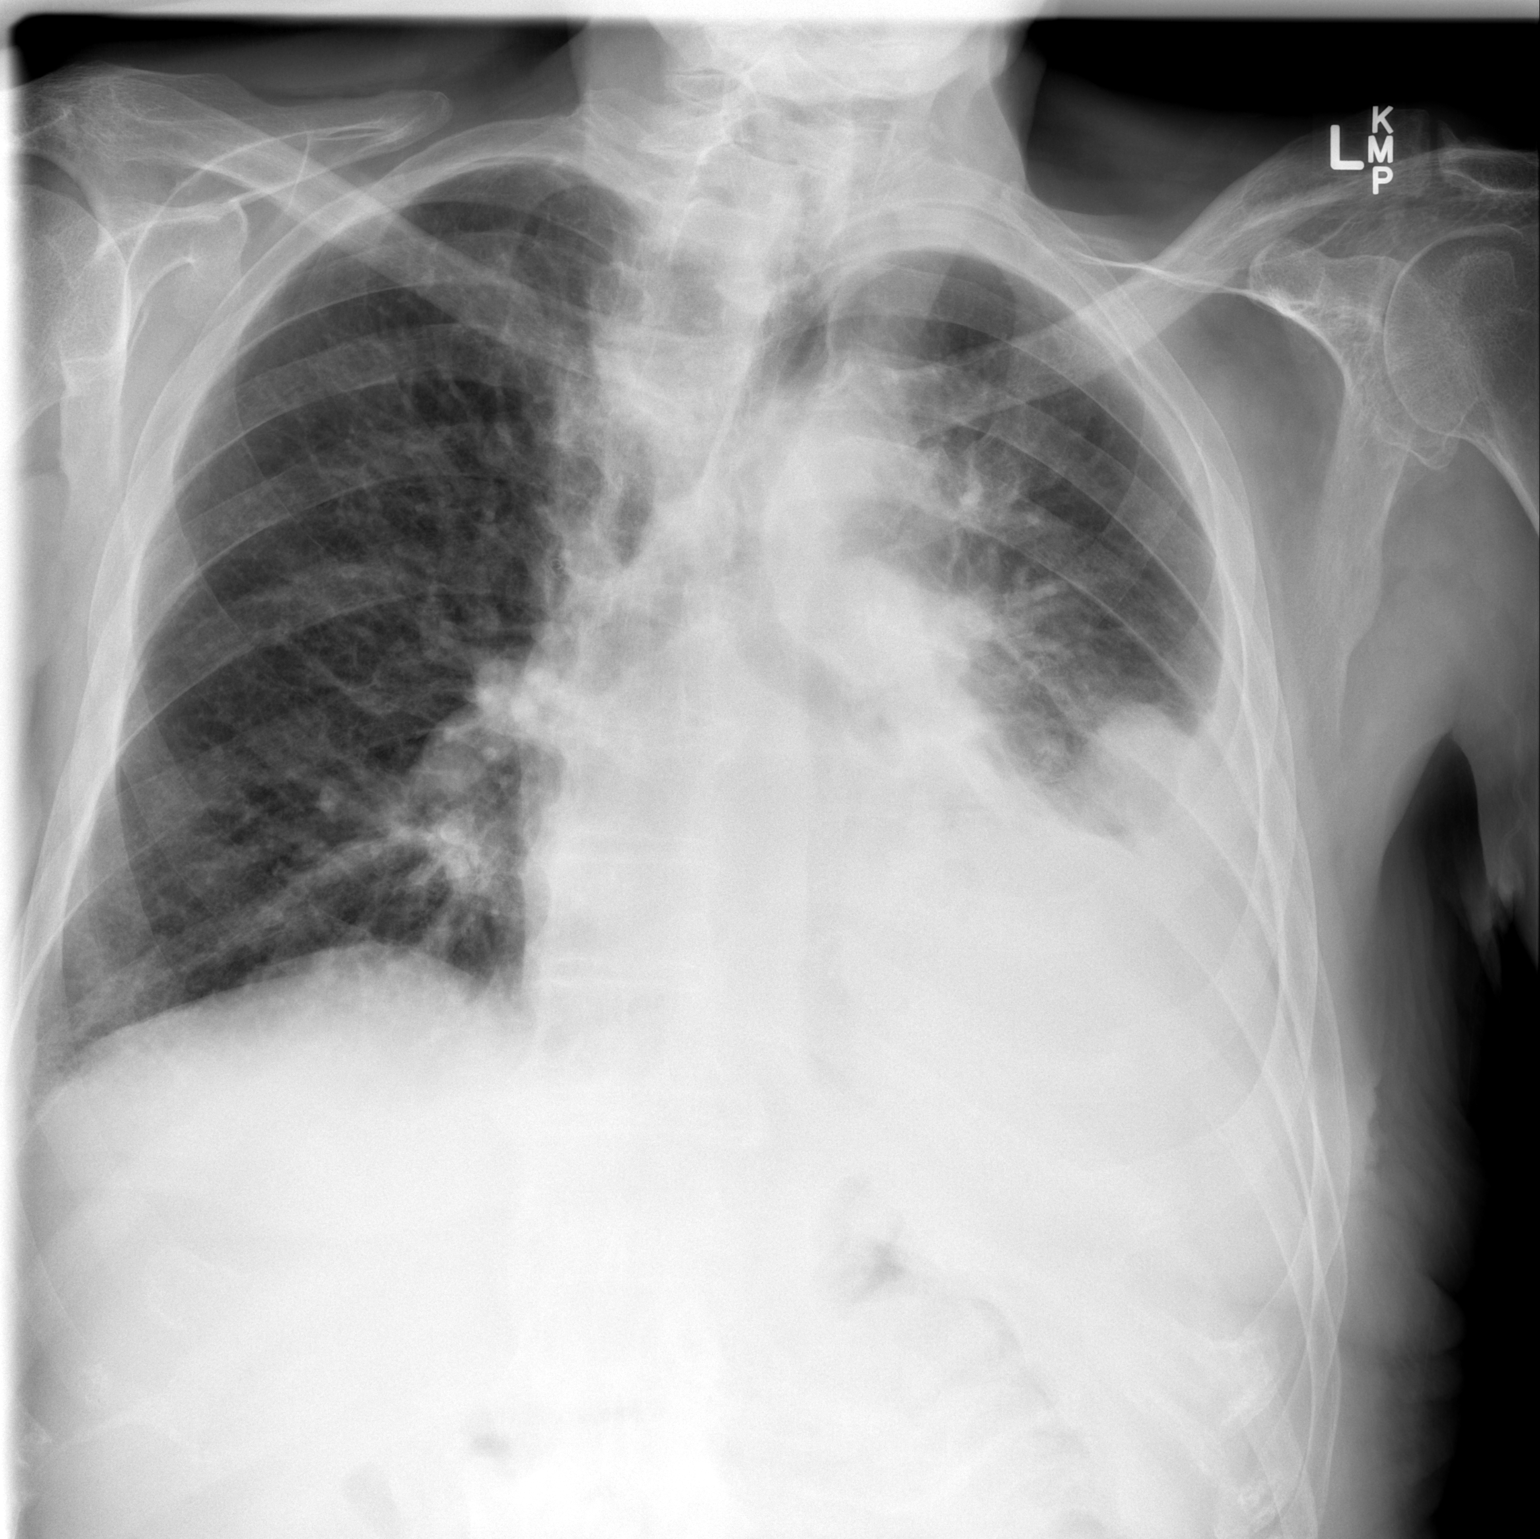

[w chest lat]
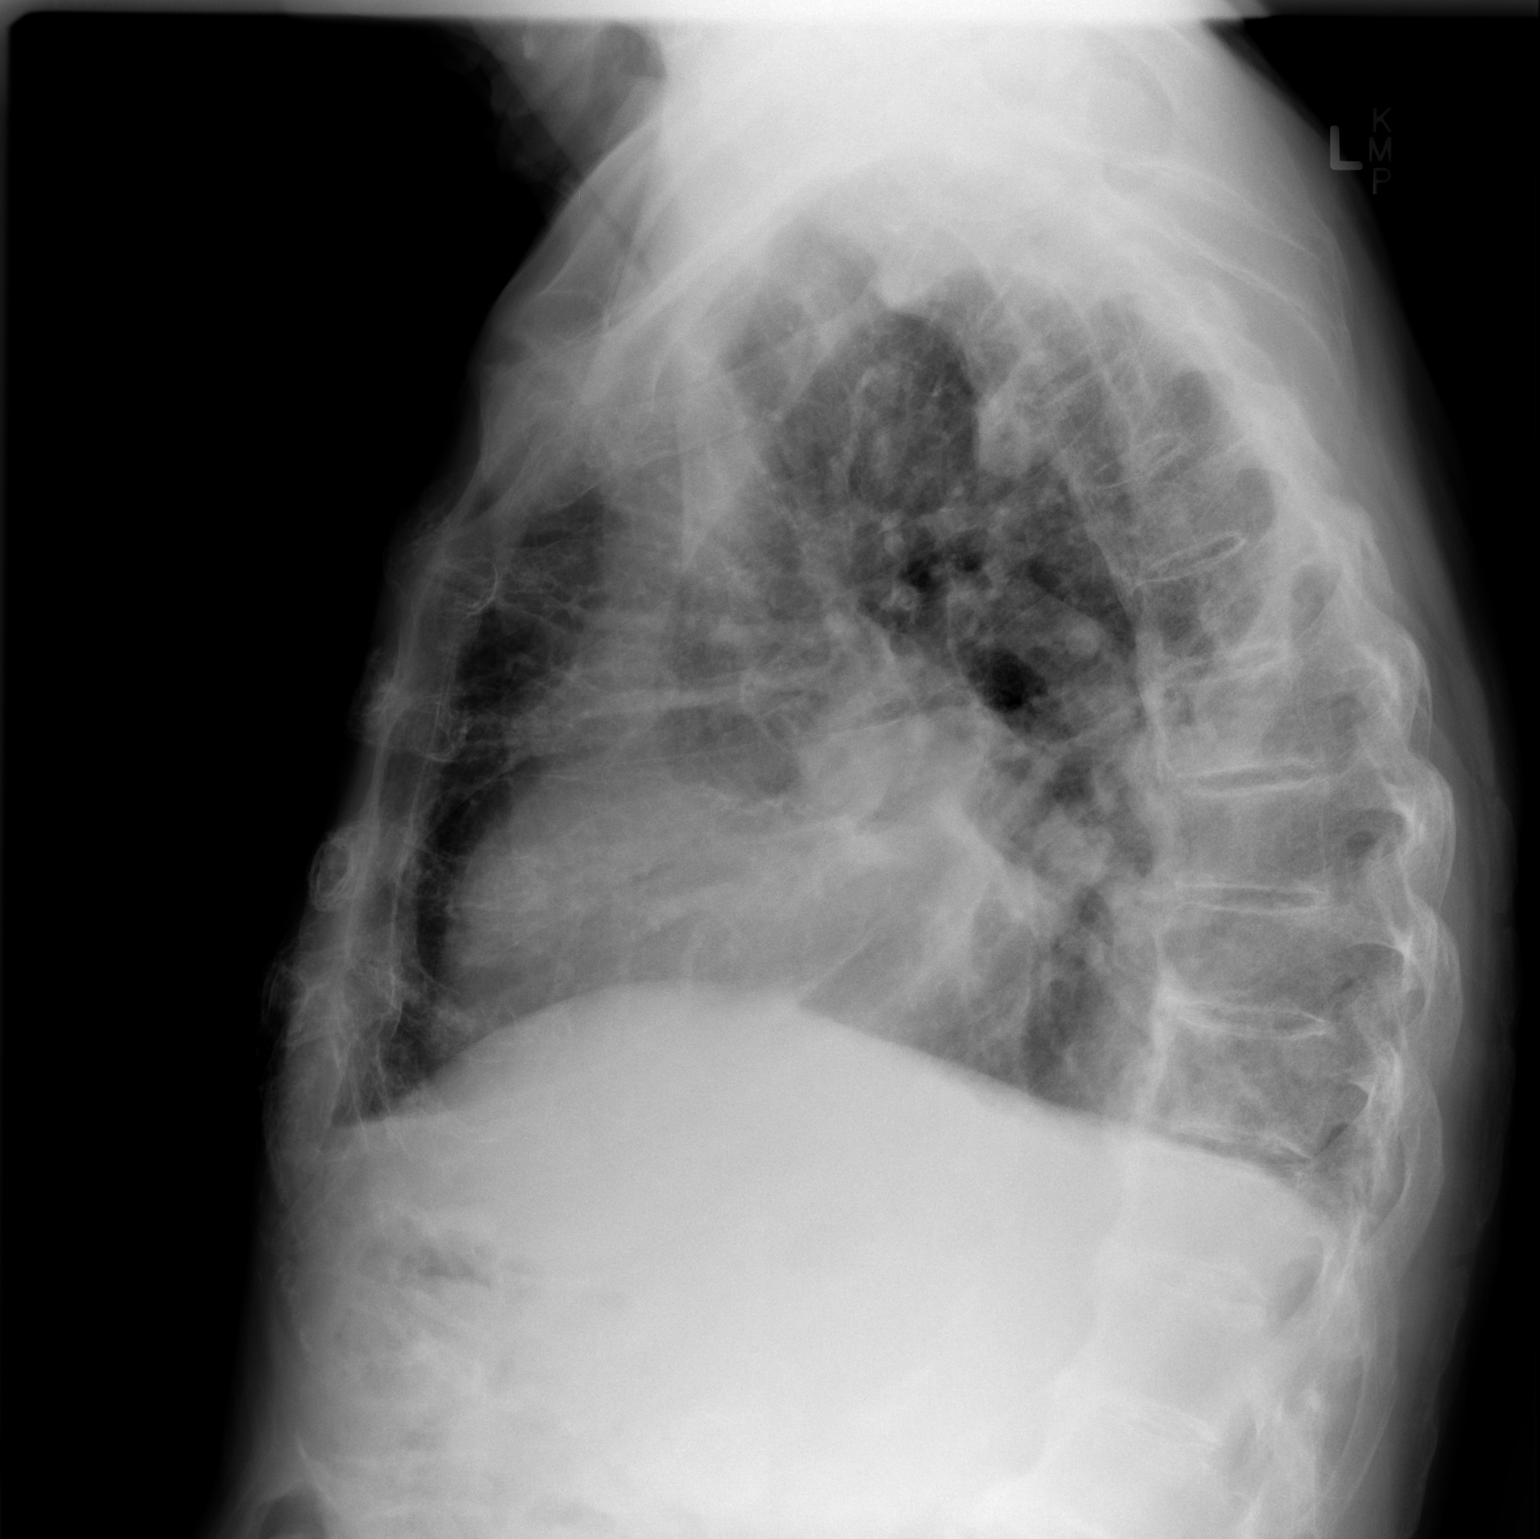

[2 of 2 positions shown; findings below may reference images not displayed]

FINDINGS: There has been an interval increase in the volume of pleural fluid
on the left. The retrocardiac region is more dense. The left heart
border is largely obscured. On the right the pulmonary interstitial
markings are mildly increased but the lung is adequately inflated
and exhibits no pleural effusion. The mediastinum is normal in
width. The bony thorax exhibits no acute abnormality. There is
degenerative change of the left shoulder.
IMPRESSION: Worsening of a left pleural effusion likely secondary to CHF. There
may be underlying left lower lobe pneumonia or atelectasis.

Follow-up radiographs following anticipated therapy are recommended.
Chest CT scanning may be ultimately indicated to further evaluate
the thorax.
